# Patient Record
Sex: Female | Born: 1975 | Race: Black or African American | Hispanic: No | Marital: Single | Smoking: Current every day smoker
Health system: Southern US, Community
[De-identification: ages and names within clinical notes are randomized; demographics above are authoritative.]

## PROBLEM LIST (undated history)

## (undated) ENCOUNTER — Inpatient Hospital Stay (HOSPITAL_COMMUNITY): Payer: Self-pay

## (undated) DIAGNOSIS — K219 Gastro-esophageal reflux disease without esophagitis: Secondary | ICD-10-CM

## (undated) DIAGNOSIS — B9681 Helicobacter pylori [H. pylori] as the cause of diseases classified elsewhere: Secondary | ICD-10-CM

## (undated) DIAGNOSIS — O00109 Unspecified tubal pregnancy without intrauterine pregnancy: Secondary | ICD-10-CM

## (undated) HISTORY — PX: ECTOPIC PREGNANCY SURGERY: SHX613

## (undated) HISTORY — PX: OTHER SURGICAL HISTORY: SHX169

## (undated) HISTORY — PX: TUBAL LIGATION: SHX77

## (undated) HISTORY — PX: ANKLE SURGERY: SHX546

---

## 2014-09-16 ENCOUNTER — Emergency Department (HOSPITAL_BASED_OUTPATIENT_CLINIC_OR_DEPARTMENT_OTHER)
Admission: EM | Admit: 2014-09-16 | Discharge: 2014-09-16 | Disposition: A | Discharge status: Home / Self Care | Payer: Medicaid Other | Attending: Emergency Medicine | Admitting: Emergency Medicine

## 2014-09-16 ENCOUNTER — Encounter (HOSPITAL_BASED_OUTPATIENT_CLINIC_OR_DEPARTMENT_OTHER): Payer: Self-pay | Admitting: *Deleted

## 2014-09-16 DIAGNOSIS — Z720 Tobacco use: Secondary | ICD-10-CM | POA: Insufficient documentation

## 2014-09-16 DIAGNOSIS — Z3202 Encounter for pregnancy test, result negative: Secondary | ICD-10-CM | POA: Diagnosis not present

## 2014-09-16 DIAGNOSIS — M549 Dorsalgia, unspecified: Secondary | ICD-10-CM | POA: Insufficient documentation

## 2014-09-16 DIAGNOSIS — N939 Abnormal uterine and vaginal bleeding, unspecified: Secondary | ICD-10-CM | POA: Insufficient documentation

## 2014-09-16 DIAGNOSIS — Z880 Allergy status to penicillin: Secondary | ICD-10-CM | POA: Diagnosis not present

## 2014-09-16 DIAGNOSIS — R109 Unspecified abdominal pain: Secondary | ICD-10-CM

## 2014-09-16 DIAGNOSIS — Z91040 Latex allergy status: Secondary | ICD-10-CM | POA: Insufficient documentation

## 2014-09-16 HISTORY — DX: Unspecified tubal pregnancy without intrauterine pregnancy: O00.109

## 2014-09-16 LAB — WET PREP, GENITAL
Trich, Wet Prep: NONE SEEN
YEAST WET PREP: NONE SEEN

## 2014-09-16 LAB — HCG, QUANTITATIVE, PREGNANCY: hCG, Beta Chain, Quant, S: 2 m[IU]/mL (ref <5)

## 2014-09-16 MED ORDER — IBUPROFEN 400 MG PO TABS: 400.0000 mg | ORAL_TABLET | Freq: Four times a day (QID) | ORAL | Status: DC | PRN

## 2014-09-16 NOTE — ED Provider Notes (Signed)
CSN: [id number]     Arrival date & time 09/16/14  1935 History  This chart is scribed for non-physician practitioner, Joycie PeekBenjamin Warnell Rasnic, PA-C, working with Geoffery Lyonsouglas Delo, MD by Abel PrestoKara Demonbreun, ED Scribe.  This patient was seen in room MH03/MH03 and the patient's care was started 8:01 PM.     Chief Complaint  Patient presents with  . Vaginal Bleeding     The history is provided by the patient. No language interpreter was used.   HPI Comments: Alexandra Lopez is a 39 y.o. female who presents to the Emergency Department complaining of vaginal spotting with onset this morning, worsening this evening to heavier bleeding. Pt denies passing clots but states blood seems like a "jelly". Pt notes associated lower back pain and abdominal bloating. Pt was just told she is [redacted] weeks pregnant. Per pt's records pt was seen for a pap smear f/u and was told that she is pregnant. Pt was then seen yesterday for vaginal bleeding and cramping at [facility] with US done showing no intrauterine pregnancy. Pt's GPA is 7/4/2. Pt denies fever, chills, nausea, and vomiting.   Past Medical History  Diagnosis Date  . Tubal pregnancy    History reviewed. No pertinent past surgical history. History reviewed. No pertinent family history. History  Substance Use Topics  . Smoking status: Current Every Day Smoker -- 0.50 packs/day    Types: Cigarettes  . Smokeless tobacco: Not on file  . Alcohol Use: No   OB History    No data available     Review of Systems  Constitutional: Negative for fever and chills.  Gastrointestinal: Positive for abdominal pain and abdominal distention. Negative for nausea and vomiting.  Genitourinary: Positive for vaginal bleeding.  Musculoskeletal: Positive for back pain.  All other systems reviewed and are negative.     Allergies  Latex and Penicillins  Home Medications   Prior to Admission medications   Medication Sig Start Date End Date Taking? Authorizing Provider   ibuprofen (ADVIL,MOTRIN) 400 MG tablet Take 1 tablet (400 mg total) by mouth every 6 (six) hours as needed. 09/16/14   Joycie PeekBenjamin Adelaida Reindel, PA-C   BP 122/79 mmHg  Pulse 61  Temp(Src) 98.4 F (36.9 C)  Resp 16  Ht 5\' 4"  (1.626 m)  Wt 130 lb (58.968 kg)  BMI 22.30 kg/m2  SpO2 100%  LMP 08/21/2014 Physical Exam  Constitutional: She is oriented to person, place, and time. She appears well-developed and well-nourished.  HENT:  Head: Normocephalic.  Eyes: Conjunctivae are normal.  Neck: Normal range of motion. Neck supple.  Cardiovascular: Normal rate, regular rhythm and normal heart sounds.  Exam reveals no friction rub.   No murmur heard. Pulmonary/Chest: Breath sounds normal. No respiratory distress. She has no wheezes. She has no rales.  Clear to auscultation bilat  Abdominal: Soft. She exhibits no distension and no mass. There is no tenderness. There is no rebound and no guarding.  Genitourinary:  Chaperone was present for the entire genital exam. No lesions or rashes appreciated on vulva. Cervix visualized on speculum exam and appropriate cultures sampled. Copious blood in vaginal vault and coming from cervical os. Os appears closed Discharge: Not appreciated Upon bi manual exam- No TTP of the adnexa, no cervical motion tenderness. No fullness or masses appreciated. No abnormalities appreciated in structural anatomy.   Musculoskeletal: Normal range of motion.  Neurological: She is alert and oriented to person, place, and time.  Skin: Skin is warm and dry.  Psychiatric: She has a  normal mood and affect. Her behavior is normal.  Nursing note and vitals reviewed.   ED Course  Procedures (including critical care time) DIAGNOSTIC STUDIES: Oxygen Saturation is 100% on room air, normal by my interpretation.    COORDINATION OF CARE: 8:06 PM Discussed treatment plan with patient at beside, the patient agrees with the plan and has no further questions at this time.   Labs  Review Labs Reviewed  WET PREP, GENITAL - Abnormal; Notable for the following:    Clue Cells Wet Prep HPF POC FEW (*)    WBC, Wet Prep HPF POC FEW (*)    All other components within normal limits  HCG, QUANTITATIVE, PREGNANCY  HIV ANTIBODY (ROUTINE TESTING)  GC/CHLAMYDIA PROBE AMP ([place]) NOT AT [facility]    Imaging Review No results found.   EKG Interpretation None      MDM  Vitals stable - WNL -afebrile Pt resting comfortably in ED. PE--copious blood on pelvic exam. No obvious products of conception. Os appears closed. Labwork--beta hCG 2.  DDX--patient seen yesterday at [facility] where they conducted a OB ultrasound that was negative, beta hCG less than 5. Patient reports she has not missed a period. After her evaluation today, this is most likely early menstrual period or miscarriage. There is no evidence of current viable pregnancy. Discussed follow-up with primary care for further evaluation and management of symptoms.  I discussed all relevant lab findings and imaging results with pt and they verbalized understanding. Discussed f/u with PCP within 48 hrs and return precautions, pt very amenable to plan. Prior to patient discharge, I discussed and reviewed this case with Dr. Judd Lien   Final diagnoses:  Abdominal cramping  Vaginal bleeding    I personally performed the services described in this documentation, which was scribed in my presence. The recorded information has been reviewed and is accurate.     Joycie Peek, PA-C 09/16/14 2115  Geoffery Lyons, MD 09/19/14 [phone]

## 2014-09-16 NOTE — Discharge Instructions (Signed)
You were evaluated in the ED for your abdominal discomfort and vaginal bleeding. There does not appear to be an emergent cause for your symptoms at this time. This is likely your menstrual cycle. Your beta hCG was negative and there is no evidence of a current pregnancy. Please follow-up with your primary care for further evaluation and management of your symptoms. Return to ED for new or worsening symptoms.  Abdominal Pain, Women Abdominal (stomach, pelvic, or belly) pain can be caused by many things. It is important to tell your doctor:  The location of the pain.  Does it come and go or is it present all the time?  Are there things that start the pain (eating certain foods, exercise)?  Are there other symptoms associated with the pain (fever, nausea, vomiting, diarrhea)? All of this is helpful to know when trying to find the cause of the pain. CAUSES   Stomach: virus or bacteria infection, or ulcer.  Intestine: appendicitis (inflamed appendix), regional ileitis (Crohn's disease), ulcerative colitis (inflamed colon), irritable bowel syndrome, diverticulitis (inflamed diverticulum of the colon), or cancer of the stomach or intestine.  Gallbladder disease or stones in the gallbladder.  Kidney disease, kidney stones, or infection.  Pancreas infection or cancer.  Fibromyalgia (pain disorder).  Diseases of the female organs:  Uterus: fibroid (non-cancerous) tumors or infection.  Fallopian tubes: infection or tubal pregnancy.  Ovary: cysts or tumors.  Pelvic adhesions (scar tissue).  Endometriosis (uterus lining tissue growing in the pelvis and on the pelvic organs).  Pelvic congestion syndrome (female organs filling up with blood just before the menstrual period).  Pain with the menstrual period.  Pain with ovulation (producing an egg).  Pain with an IUD (intrauterine device, birth control) in the uterus.  Cancer of the female organs.  Functional pain (pain not caused by  a disease, may improve without treatment).  Psychological pain.  Depression. DIAGNOSIS  Your doctor will decide the seriousness of your pain by doing an examination.  Blood tests.  X-rays.  Ultrasound.  CT scan (computed tomography, special type of X-ray).  MRI (magnetic resonance imaging).  Cultures, for infection.  Barium enema (dye inserted in the large intestine, to better view it with X-rays).  Colonoscopy (looking in intestine with a lighted tube).  Laparoscopy (minor surgery, looking in abdomen with a lighted tube).  Major abdominal exploratory surgery (looking in abdomen with a large incision). TREATMENT  The treatment will depend on the cause of the pain.   Many cases can be observed and treated at home.  Over-the-counter medicines recommended by your caregiver.  Prescription medicine.  Antibiotics, for infection.  Birth control pills, for painful periods or for ovulation pain.  Hormone treatment, for endometriosis.  Nerve blocking injections.  Physical therapy.  Antidepressants.  Counseling with a psychologist or psychiatrist.  Minor or major surgery. HOME CARE INSTRUCTIONS   Do not take laxatives, unless directed by your caregiver.  Take over-the-counter pain medicine only if ordered by your caregiver. Do not take aspirin because it can cause an upset stomach or bleeding.  Try a clear liquid diet (broth or water) as ordered by your caregiver. Slowly move to a bland diet, as tolerated, if the pain is related to the stomach or intestine.  Have a thermometer and take your temperature several times a day, and record it.  Bed rest and sleep, if it helps the pain.  Avoid sexual intercourse, if it causes pain.  Avoid stressful situations.  Keep your follow-up appointments and tests,  as your caregiver orders.  If the pain does not go away with medicine or surgery, you may try:  Acupuncture.  Relaxation exercises (yoga, meditation).  Group  therapy.  Counseling. SEEK MEDICAL CARE IF:   You notice certain foods cause stomach pain.  Your home care treatment is not helping your pain.  You need stronger pain medicine.  You want your IUD removed.  You feel faint or lightheaded.  You develop nausea and vomiting.  You develop a rash.  You are having side effects or an allergy to your medicine. SEEK IMMEDIATE MEDICAL CARE IF:   Your pain does not go away or gets worse.  You have a fever.  Your pain is felt only in portions of the abdomen. The right side could possibly be appendicitis. The left lower portion of the abdomen could be colitis or diverticulitis.  You are passing blood in your stools (bright red or black tarry stools, with or without vomiting).  You have blood in your urine.  You develop chills, with or without a fever.  You pass out. MAKE SURE YOU:   Understand these instructions.  Will watch your condition.  Will get help right away if you are not doing well or get worse. Document Released: 12/19/2006 Document Revised: 07/08/2013 Document Reviewed: 01/08/2009 [facility] Patient Information 2015 [employer] [place]. This information is not intended to replace advice given to you by your health care provider. Make sure you discuss any questions you have with your health care provider.

## 2014-09-16 NOTE — ED Notes (Signed)
Pt c/o vaginal bleeding x 12 hrs pt is 3 weeks preg c/o lower back and  abd pain.

## 2014-09-17 LAB — GC/CHLAMYDIA PROBE AMP (~~LOC~~) NOT AT ARMC
Chlamydia: NEGATIVE
Neisseria Gonorrhea: NEGATIVE

## 2014-09-18 LAB — HIV ANTIBODY (ROUTINE TESTING W REFLEX): HIV Screen 4th Generation wRfx: NONREACTIVE

## 2017-08-02 ENCOUNTER — Encounter (HOSPITAL_BASED_OUTPATIENT_CLINIC_OR_DEPARTMENT_OTHER): Payer: Self-pay

## 2017-08-02 ENCOUNTER — Emergency Department (HOSPITAL_BASED_OUTPATIENT_CLINIC_OR_DEPARTMENT_OTHER): Payer: Medicaid Other

## 2017-08-02 ENCOUNTER — Emergency Department (HOSPITAL_BASED_OUTPATIENT_CLINIC_OR_DEPARTMENT_OTHER)
Admission: EM | Admit: 2017-08-02 | Discharge: 2017-08-02 | Disposition: A | Discharge status: Home / Self Care | Payer: Medicaid Other | Attending: Emergency Medicine | Admitting: Emergency Medicine

## 2017-08-02 DIAGNOSIS — O9989 Other specified diseases and conditions complicating pregnancy, childbirth and the puerperium: Secondary | ICD-10-CM | POA: Diagnosis not present

## 2017-08-02 DIAGNOSIS — O209 Hemorrhage in early pregnancy, unspecified: Secondary | ICD-10-CM | POA: Insufficient documentation

## 2017-08-02 DIAGNOSIS — O99331 Smoking (tobacco) complicating pregnancy, first trimester: Secondary | ICD-10-CM | POA: Diagnosis not present

## 2017-08-02 DIAGNOSIS — O4690 Antepartum hemorrhage, unspecified, unspecified trimester: Secondary | ICD-10-CM

## 2017-08-02 DIAGNOSIS — F1210 Cannabis abuse, uncomplicated: Secondary | ICD-10-CM | POA: Insufficient documentation

## 2017-08-02 DIAGNOSIS — R1030 Lower abdominal pain, unspecified: Secondary | ICD-10-CM | POA: Insufficient documentation

## 2017-08-02 DIAGNOSIS — F17210 Nicotine dependence, cigarettes, uncomplicated: Secondary | ICD-10-CM | POA: Diagnosis not present

## 2017-08-02 DIAGNOSIS — Z3A08 8 weeks gestation of pregnancy: Secondary | ICD-10-CM | POA: Diagnosis not present

## 2017-08-02 MED ORDER — ACETAMINOPHEN 325 MG PO TABS
650.0000 mg | ORAL_TABLET | Freq: Once | ORAL | Status: AC
  Administered 2017-08-02: 650 mg via ORAL
  Filled 2017-08-02: qty 2

## 2017-08-02 NOTE — ED Notes (Signed)
Water provided to pt per ok from EDP.

## 2017-08-02 NOTE — ED Triage Notes (Signed)
Pt states she is [redacted] weeks pregnant-had vaginal spotting last week-vaginal bleeding started today-panty liner x 2 in place-NAD-steady gait

## 2017-08-02 NOTE — Discharge Instructions (Addendum)
It was so nice to meet you.  You came into the emergency department with vaginal bleeding. Your ultrasound showed a normal appearing fetus with a heart rate of 160. The ultrasound did not identify any cause of your bleeding. Please make sure you follow-up with your OBGYN in the next week.

## 2017-08-02 NOTE — ED Provider Notes (Signed)
[facility] EMERGENCY DEPARTMENT Provider Note   CSN: [id number] Arrival date & time: 08/02/17  1718   History   Chief Complaint Chief Complaint  Patient presents with  . Vaginal Bleeding    HPI Alexandra Lopez is a 42 y.o. female [phone] at [redacted] weeks gestation presenting to the ED with vaginal bleeding that started today. She states she was about to take a bath when she noticed that she had "blood all in her underwear". She states that it looked like "blood and particles". The bleeding has since slowed up. She endorses lower abdominal "pressure" and "crampy pain". She has not had any other issues this pregnancy other than nausea and vomiting.  Past Medical History:  Diagnosis Date  . Tubal pregnancy     There are no active problems to display for this patient.   Past Surgical History:  Procedure Laterality Date  . ANKLE SURGERY    . arm surgery    . ECTOPIC PREGNANCY SURGERY       OB History    Gravida  8   Para  5   Term  5   Preterm      AB  2   Living  5     SAB  1   TAB      Ectopic  1   Multiple      Live Births               Home Medications    Prior to Admission medications   Medication Sig Start Date End Date Taking? Authorizing Provider  ibuprofen (ADVIL,MOTRIN) 400 MG tablet Take 1 tablet (400 mg total) by mouth every 6 (six) hours as needed. 09/16/14   Joycie Peek, PA-C    Family History No family history on file.  Social History Social History   Tobacco Use  . Smoking status: Current Every Day Smoker    Packs/day: 0.50    Types: Cigarettes  . Smokeless tobacco: Never Used  Substance Use Topics  . Alcohol use: No  . Drug use: Yes    Types: Marijuana     Allergies   Latex and Penicillins   Review of Systems Review of Systems  Constitutional: Negative for chills and fever.  Respiratory: Negative for shortness of breath.   Cardiovascular: Negative for chest pain and palpitations.  Gastrointestinal:  Positive for abdominal pain, constipation, nausea and vomiting.  Genitourinary: Negative for difficulty urinating and dysuria.  Musculoskeletal: Negative for back pain and neck pain.  Neurological: Negative for dizziness, light-headedness and headaches.  All other systems reviewed and are negative.  Physical Exam Updated Vital Signs BP (!) 152/89 (BP Location: Left Arm)   Pulse 80   Temp 98.8 F (37.1 C) (Oral)   Resp 18   Ht  (1.626 m)   Wt 64 kg (141 lb)   LMP 06/05/2017   SpO2 100%   BMI 24.20 kg/m   Physical Exam  Constitutional: She appears well-developed and well-nourished. No distress.  HENT:  Head: Normocephalic and atraumatic.  Eyes: Conjunctivae are normal.  Neck: Neck supple.  Cardiovascular: Normal rate and regular rhythm.  No murmur heard. Pulmonary/Chest: Effort normal and breath sounds normal. No respiratory distress.  Abdominal: Soft.  Mild tenderness in the suprapubic and LLQ  Genitourinary:  Genitourinary Comments: External genitalia normal in appearance, vaginal walls normal, cervix open with moderate amount of blood coming from the os  Musculoskeletal: She exhibits no edema.  Neurological: She is alert.  Skin: Skin is  warm and dry.  Psychiatric: She has a normal mood and affect.  Nursing note and vitals reviewed.   ED Treatments / Results  Labs (all labs ordered are listed, but only abnormal results are displayed) Labs Reviewed - No data to display  EKG None  Radiology No results found.  Procedures Procedures (including critical care time)  Medications Ordered in ED Medications - No data to display   Initial Impression / Assessment and Plan / ED Course  I have reviewed the triage vital signs and the nursing notes.  Pertinent labs & imaging results that were available during my care of the patient were reviewed by me and considered in my medical decision making (see chart for details).  42 year old [id number] at [redacted] weeks gestation  presenting with vaginal bleeding. History consistent with a spontaneous abortion. Exam with bright red blood coming from an open cervical ox. ABO/Rh result from 2017 showed that her blood type was O positive, so will not repeat this lab. Transvaginal US showed IUP with HR of 160bpm. [place] was unable to identify a cause of the bleeding. Patient has already has an appointment scheduled with her OBGYN on 08/08/17. She is safe for discharge home.  Final Clinical Impressions(s) / ED Diagnoses   Final diagnoses:  None    ED Discharge Orders    None       Mayo, Allyn Kenner, MD 08/02/17 2157    Maia Plan, MD 08/03/17 1321

## 2017-08-06 ENCOUNTER — Other Ambulatory Visit: Payer: Self-pay

## 2017-08-06 ENCOUNTER — Inpatient Hospital Stay (HOSPITAL_COMMUNITY)
Admission: AD | Admit: 2017-08-06 | Discharge: 2017-08-06 | Disposition: A | Discharge status: Home / Self Care | Payer: Medicaid Other | Source: Ambulatory Visit | Attending: Family Medicine | Admitting: Family Medicine

## 2017-08-06 ENCOUNTER — Encounter (HOSPITAL_COMMUNITY): Payer: Self-pay | Admitting: *Deleted

## 2017-08-06 DIAGNOSIS — O99331 Smoking (tobacco) complicating pregnancy, first trimester: Secondary | ICD-10-CM | POA: Insufficient documentation

## 2017-08-06 DIAGNOSIS — Z91040 Latex allergy status: Secondary | ICD-10-CM | POA: Diagnosis not present

## 2017-08-06 DIAGNOSIS — F17210 Nicotine dependence, cigarettes, uncomplicated: Secondary | ICD-10-CM | POA: Diagnosis not present

## 2017-08-06 DIAGNOSIS — Z3A00 Weeks of gestation of pregnancy not specified: Secondary | ICD-10-CM | POA: Insufficient documentation

## 2017-08-06 DIAGNOSIS — N939 Abnormal uterine and vaginal bleeding, unspecified: Secondary | ICD-10-CM | POA: Diagnosis present

## 2017-08-06 DIAGNOSIS — O209 Hemorrhage in early pregnancy, unspecified: Secondary | ICD-10-CM | POA: Diagnosis not present

## 2017-08-06 DIAGNOSIS — Z880 Allergy status to penicillin: Secondary | ICD-10-CM | POA: Insufficient documentation

## 2017-08-06 LAB — CBC
HCT: 37.9 % (ref 36.0–46.0)
HEMOGLOBIN: 13 g/dL (ref 12.0–15.0)
MCH: 29.9 pg (ref 26.0–34.0)
MCHC: 34.3 g/dL (ref 30.0–36.0)
MCV: 87.1 fL (ref 78.0–100.0)
Platelets: 395 10*3/uL (ref 150–400)
RBC: 4.35 MIL/uL (ref 3.87–5.11)
RDW: 13.9 % (ref 11.5–15.5)
WBC: 10.2 10*3/uL (ref 4.0–10.5)

## 2017-08-06 LAB — ABO/RH: ABO/RH(D): O POS

## 2017-08-06 MED ORDER — ONDANSETRON 8 MG PO TBDP
8.0000 mg | ORAL_TABLET | Freq: Once | ORAL | Status: AC
  Administered 2017-08-06: 8 mg via ORAL
  Filled 2017-08-06: qty 1

## 2017-08-06 NOTE — MAU Provider Note (Signed)
History   [id number] in with vaginal bleeding that has been going on for a week was seen on 5/28 and noted to have viable fetus at 8 wks. Has to change pads 2-3 times a day. No clots noted today. Also c/o some nausea.  CSN: [id number]  Arrival date & time 08/06/17  1326   None     Chief Complaint  Patient presents with  . Abdominal Pain  . Vaginal Bleeding    HPI  Past Medical History:  Diagnosis Date  . Tubal pregnancy     Past Surgical History:  Procedure Laterality Date  . ANKLE SURGERY    . arm surgery    . ECTOPIC PREGNANCY SURGERY      History reviewed. No pertinent family history.  Social History   Tobacco Use  . Smoking status: Current Every Day Smoker    Packs/day: 0.25    Types: Cigarettes  . Smokeless tobacco: Never Used  Substance Use Topics  . Alcohol use: No  . Drug use: Yes    Types: Marijuana    Comment: occ    OB History    Gravida  8   Para  5   Term  5   Preterm      AB  2   Living  5     SAB  1   TAB      Ectopic  1   Multiple      Live Births              Review of Systems  Constitutional: Negative.   HENT: Negative.   Eyes: Negative.   Respiratory: Negative.   Cardiovascular: Negative.   Gastrointestinal: Positive for abdominal pain.  Endocrine: Negative.   Genitourinary: Positive for vaginal bleeding.  Musculoskeletal: Negative.   Skin: Negative.   Allergic/Immunologic: Negative.   Neurological: Negative.   Hematological: Negative.   Psychiatric/Behavioral: Negative.     Allergies  Latex and Penicillins  Home Medications    BP 101/67 (BP Location: Right Arm)   Pulse 63   Temp 98.3 F (36.8 C) (Oral)   Resp 18   Wt 139 lb 12 oz (63.4 kg)   LMP 06/05/2017   SpO2 100%   BMI 23.99 kg/m   Physical Exam  Constitutional: She is oriented to person, place, and time. She appears well-developed and well-nourished.  HENT:  Head: Normocephalic.  Cardiovascular: Normal rate, regular rhythm, normal  heart sounds and intact distal pulses.  Pulmonary/Chest: Effort normal and breath sounds normal.  Abdominal: Normal appearance.  Genitourinary: Vagina normal and uterus normal.  Neurological: She is alert and oriented to person, place, and time.  Skin: Skin is warm and dry.  Psychiatric: She has a normal mood and affect. Her behavior is normal.    MAU Course  Procedures (including critical care time)  Labs Reviewed  CBC  ABO/RH   No results found.   1. Vaginal bleeding in pregnancy, first trimester       MDM  VSS, scant amt dark vag bleeding. bedfside u/s shows viable fetus with FHR 150's. Miscarriage precautions given and to f/o with her OB next week.

## 2017-08-06 NOTE — MAU Note (Addendum)
Just keeps bleeding really bad, and her stomach keeps hurting.  Has been going on for like a wk now.  Bleeding had let up, but last night became heavy again. Now having back pain also

## 2017-08-06 NOTE — MAU Note (Deleted)
Had picture of what was in toilet. Was white, approx 1 in long, reddish in middle, ? Tissue.

## 2017-08-06 NOTE — Discharge Instructions (Signed)
Vaginal Bleeding During Pregnancy, First Trimester °A small amount of bleeding (spotting) from the vagina is common in early pregnancy. Sometimes the bleeding is normal and is not a problem, and sometimes it is a sign of something serious. Be sure to tell your doctor about any bleeding from your vagina right away. °Follow these instructions at home: °· Watch your condition for any changes. °· Follow your doctor's instructions about how active you can be. °· If you are on bed rest: °? You may need to stay in bed and only get up to use the bathroom. °? You may be allowed to do some activities. °? If you need help, make plans for someone to help you. °· Write down: °? The number of pads you use each day. °? How often you change pads. °? How soaked (saturated) your pads are. °· Do not use tampons. °· Do not douche. °· Do not have sex or orgasms until your doctor says it is okay. °· If you pass any tissue from your vagina, save the tissue so you can show it to your doctor. °· Only take medicines as told by your doctor. °· Do not take aspirin because it can make you bleed. °· Keep all follow-up visits as told by your doctor. °Contact a doctor if: °· You bleed from your vagina. °· You have cramps. °· You have labor pains. °· You have a fever that does not go away after you take medicine. °Get help right away if: °· You have very bad cramps in your back or belly (abdomen). °· You pass large clots or tissue from your vagina. °· You bleed more. °· You feel light-headed or weak. °· You pass out (faint). °· You have chills. °· You are leaking fluid or have a gush of fluid from your vagina. °· You pass out while pooping (having a bowel movement). °This information is not intended to replace advice given to you by your health care provider. Make sure you discuss any questions you have with your health care provider. °Document Released: 07/08/2013 Document Revised: 07/30/2015 Document Reviewed: 10/29/2012 °Elsevier Interactive  Patient Education © 2018 [employer] Inc. ° °

## 2018-06-04 ENCOUNTER — Encounter (HOSPITAL_COMMUNITY): Payer: Self-pay

## 2019-08-12 IMAGING — US US OB TRANSVAGINAL
1 series · 14 of 28 positions shown · non-contrast
Comparison: None.

CLINICAL DATA: Pregnant patient.  Vaginal bleeding.

EXAM:
OBSTETRIC <14 WK US AND TRANSVAGINAL OB US
TECHNIQUE: Both transabdominal and transvaginal ultrasound examinations were
performed for complete evaluation of the gestation as well as the
maternal uterus, adnexal regions, and pelvic cul-de-sac.
Transvaginal technique was performed to assess early pregnancy.

[Series 1: us ob transvaginal · 0.13mm/px · 29 acquisitions, 14 frames shown]
[im 2/29]
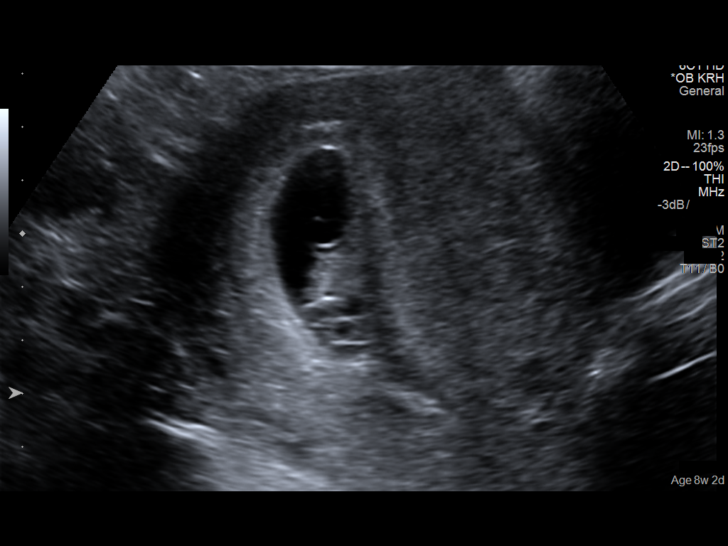
[im 4/29]
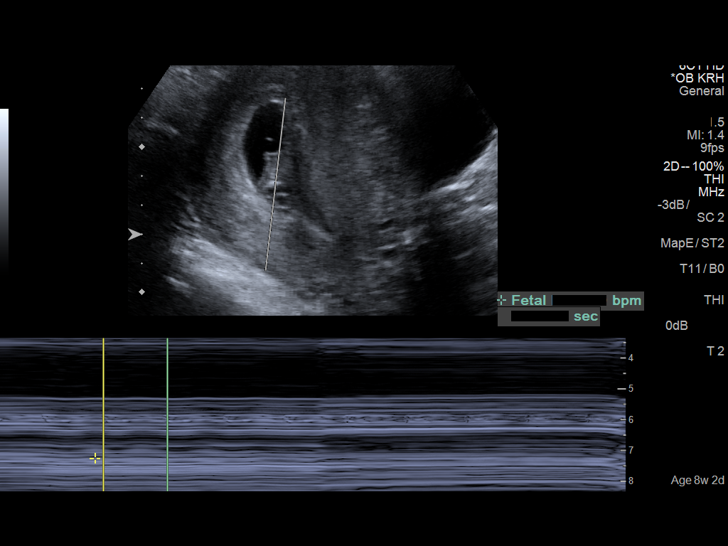
[im 6/29]
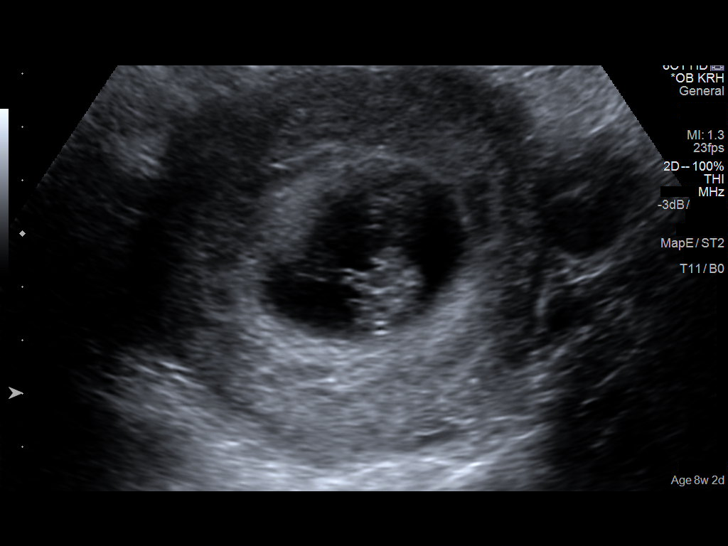
[im 8/29]
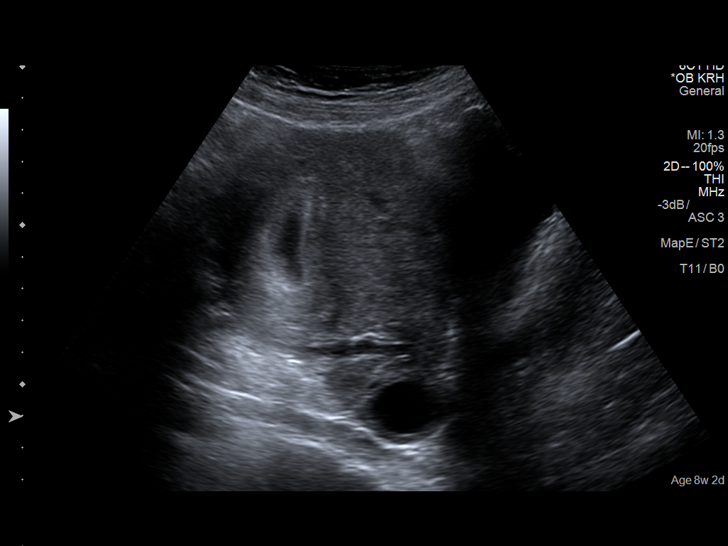
[im 10/29]
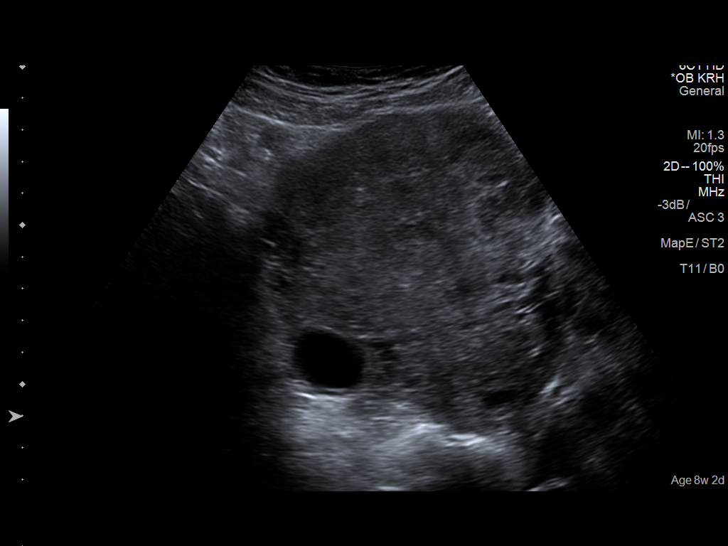
[im 12/29]
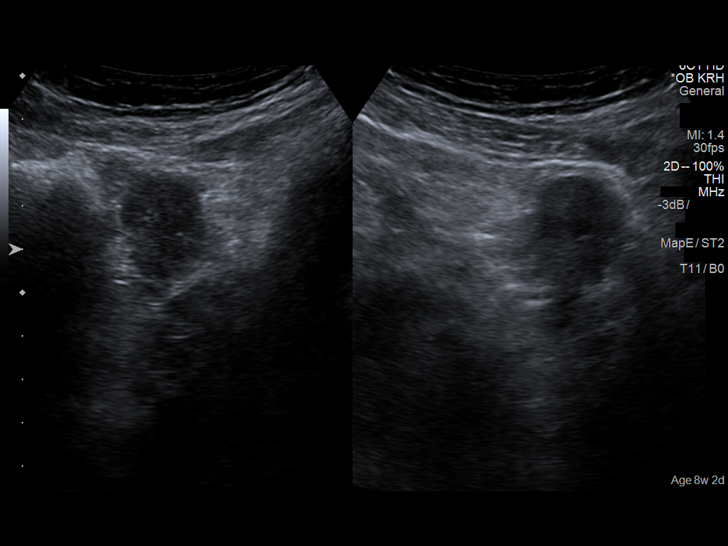
[im 14/29]
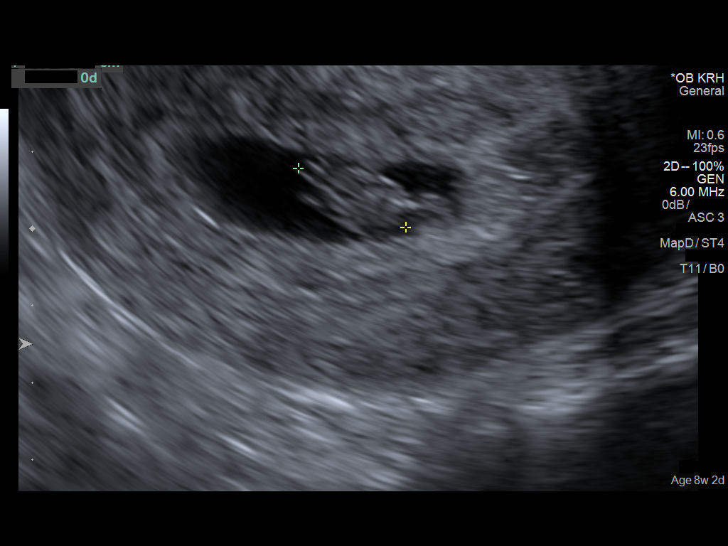
[im 16/29]
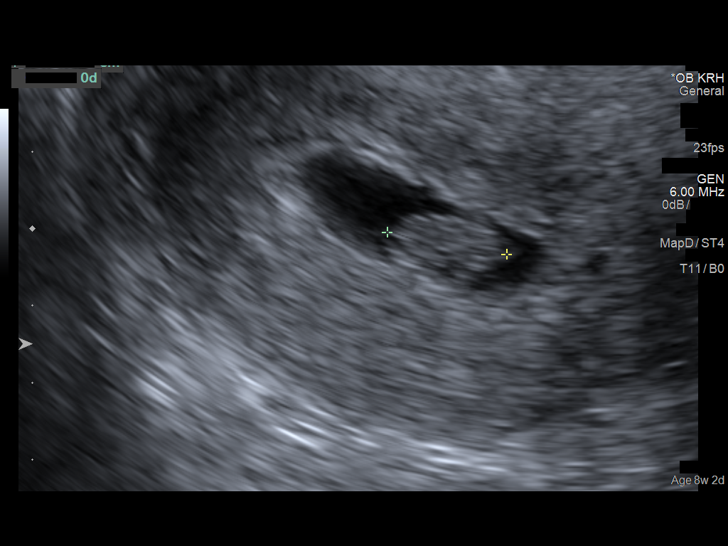
[im 18/29]
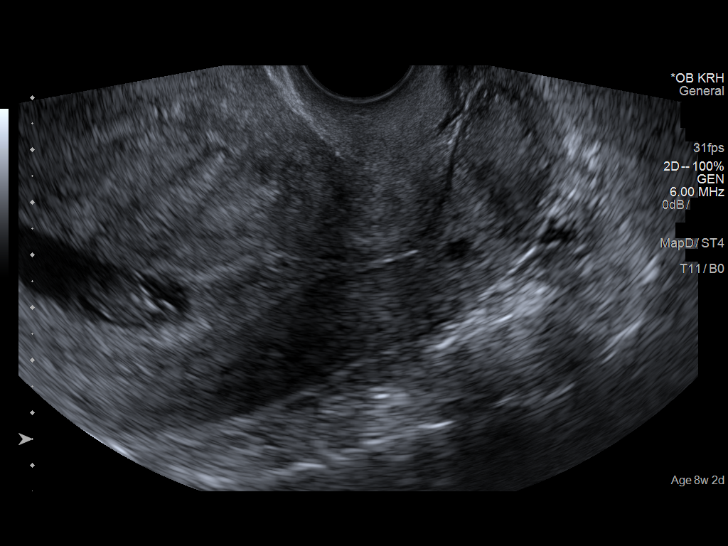
[im 20/29]
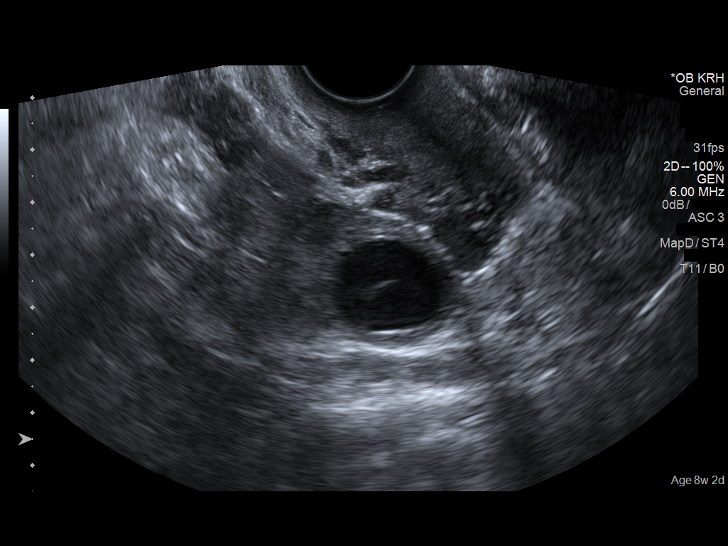
[im 22/29]
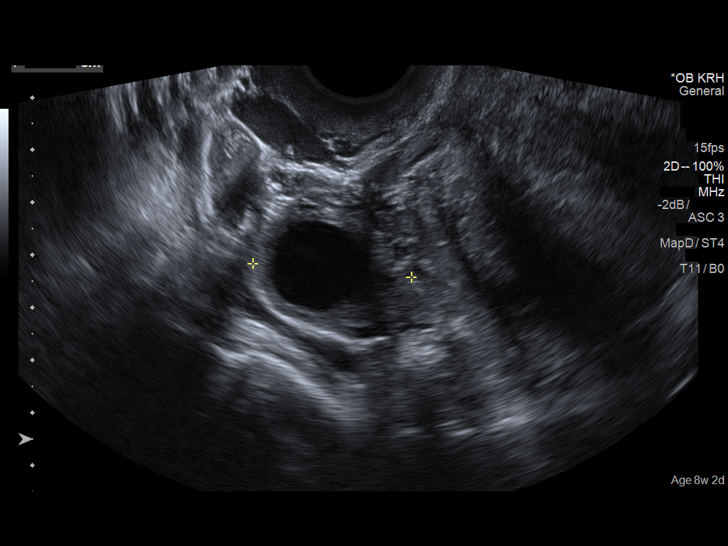
[im 24/29]
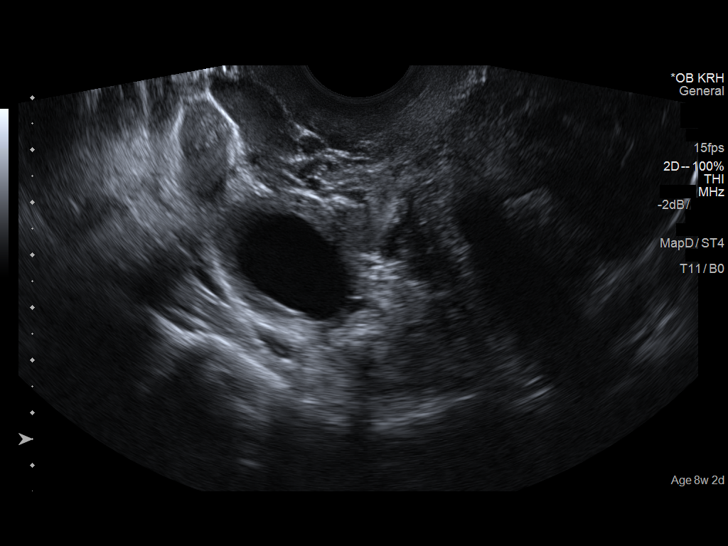
[im 26/29]
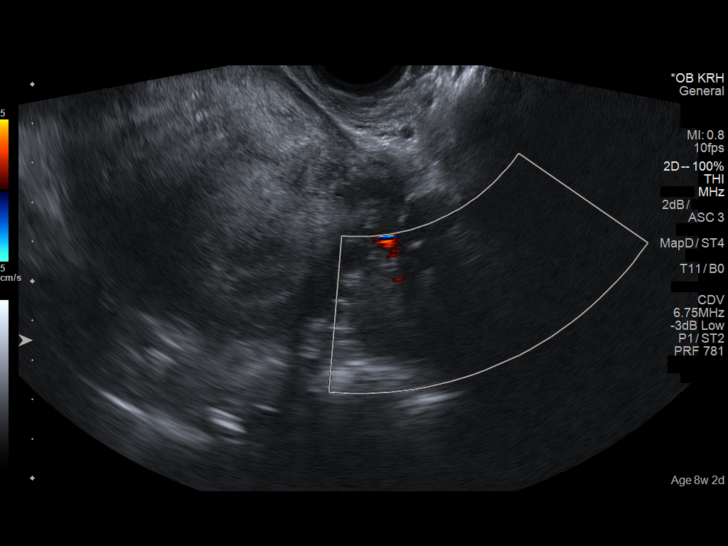
[im 29/29]
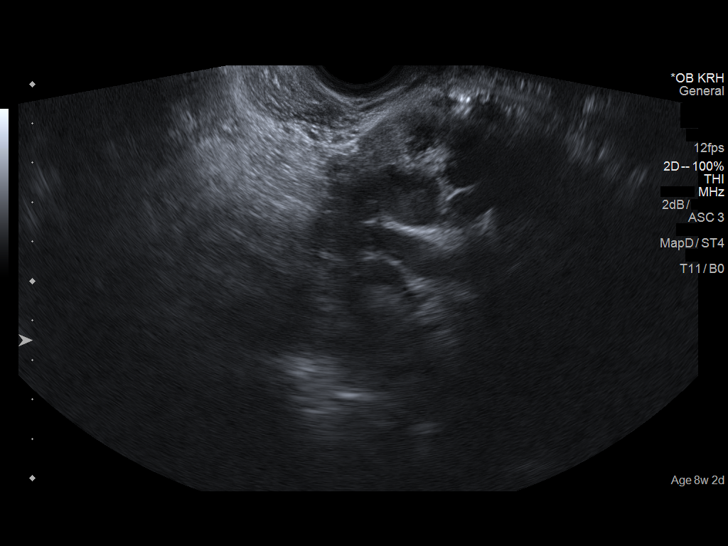

[14 of 28 positions shown; findings below may reference images not displayed]

FINDINGS: Intrauterine gestational sac: Single

Yolk sac:  Visualized.

Embryo:  Visualized.

Cardiac Activity: Visualized.

Heart Rate: 160 bpm

MSD:   mm    w     d

CRL:  15.6 mm   8 w   0 d                  US EDC: Wednesday March, 2018

Subchorionic hemorrhage:  None visualized.

Maternal uterus/adnexae: Normal
IMPRESSION: Single live IUP.  No cause for vaginal bleeding identified.

## 2019-12-26 ENCOUNTER — Emergency Department (HOSPITAL_BASED_OUTPATIENT_CLINIC_OR_DEPARTMENT_OTHER)
Admission: EM | Admit: 2019-12-26 | Discharge: 2019-12-26 | Disposition: A | Discharge status: Home / Self Care | Payer: Medicaid Other | Attending: Emergency Medicine | Admitting: Emergency Medicine

## 2019-12-26 ENCOUNTER — Other Ambulatory Visit: Payer: Self-pay

## 2019-12-26 ENCOUNTER — Encounter (HOSPITAL_BASED_OUTPATIENT_CLINIC_OR_DEPARTMENT_OTHER): Payer: Self-pay | Admitting: *Deleted

## 2019-12-26 DIAGNOSIS — M79645 Pain in left finger(s): Secondary | ICD-10-CM | POA: Diagnosis present

## 2019-12-26 DIAGNOSIS — F17210 Nicotine dependence, cigarettes, uncomplicated: Secondary | ICD-10-CM | POA: Diagnosis not present

## 2019-12-26 NOTE — Discharge Instructions (Signed)
You were seen earlier this year by Dr. Kelli Hope of [facility].  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  At that time, it looks like they recommended an MRI of your thumb.  You can contact [phone] and request a follow-up appointment with Dr. Thad Ranger.

## 2019-12-26 NOTE — ED Triage Notes (Signed)
C/o left thumb pain x 1 year

## 2019-12-26 NOTE — ED Provider Notes (Signed)
[facility] EMERGENCY DEPARTMENT Provider Note   CSN: [id number] Arrival date & time: 12/26/19  1726     History Chief Complaint  Patient presents with  . Hand Pain    Alexandra Lopez is a 44 y.o. female.  Patient presents to the emergency department for evaluation of ongoing left thumb pain.  Patient reports that she was evaluated by a specialist earlier this year.  She states that she was told that she had a tumor in her finger and that she was supposed to have imaging performed to evaluate for this.  She thinks that she received a letter stating that this was not covered and she never followed up.  No other treatments prior to arrival.  She states that it is difficult for her to use her thumb because of the pain.  No fevers, redness, or swelling.  She is requesting imaging here today.        Past Medical History:  Diagnosis Date  . Tubal pregnancy     There are no problems to display for this patient.   Past Surgical History:  Procedure Laterality Date  . ANKLE SURGERY    . arm surgery    . ECTOPIC PREGNANCY SURGERY    . TUBAL LIGATION       OB History    Gravida  8   Para  5   Term  5   Preterm      AB  2   Living  5     SAB  1   TAB      Ectopic  1   Multiple      Live Births              No family history on file.  Social History   Tobacco Use  . Smoking status: Current Every Day Smoker    Packs/day: 0.25    Types: Cigarettes  . Smokeless tobacco: Never Used  Substance Use Topics  . Alcohol use: No  . Drug use: Yes    Types: Marijuana    Comment: occ    Home Medications Prior to Admission medications   Not on File    Allergies    Latex and Penicillins  Review of Systems   Review of Systems  Constitutional: Negative for fever.  Musculoskeletal: Positive for myalgias. Negative for arthralgias and joint swelling.  Skin: Negative for color change and wound.  Neurological: Negative for numbness.    Physical  Exam Updated Vital Signs BP 123/80   Pulse 62   Temp 98.3 F (36.8 C) (Oral)   Resp 18   Ht 5\' 4"  (1.626 m)   Wt 60.8 kg   LMP 12/22/2019   SpO2 99%   BMI 23.00 kg/m   Physical Exam Vitals and nursing note reviewed.  Constitutional:      Appearance: She is well-developed.  HENT:     Head: Normocephalic and atraumatic.  Eyes:     Conjunctiva/sclera: Conjunctivae normal.  Pulmonary:     Effort: No respiratory distress.  Musculoskeletal:     Cervical back: Normal range of motion and neck supple.     Comments: Externally, patient's left thumb is normal in appearance.  She has normal range of motion at the IP and MCP joints.  She is exquisitely tender to palpation over the ulnar aspect of the thumb at the edge of the nail and onto the pulp.  No signs of eponychia or paronychia.  Nail appears normal.  Skin:  General: Skin is warm and dry.  Neurological:     Mental Status: She is alert.     ED Results / Procedures / Treatments   Labs (all labs ordered are listed, but only abnormal results are displayed) Labs Reviewed - No data to display  EKG None  Radiology No results found.  Procedures Procedures (including critical care time)  Medications Ordered in ED Medications - No data to display  ED Course  I have reviewed the triage vital signs and the nursing notes.  Pertinent labs & imaging results that were available during my care of the patient were reviewed by me and considered in my medical decision making (see chart for details).  Patient seen and examined.  I reviewed the patient's previous notes from earlier in the year when she saw Dr. Thad Ranger of plastic surgery.  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  At that time, MRI with and without contrast was recommended.   Vital signs reviewed and are as follows: BP 123/80   Pulse 62   Temp 98.3 F (36.8 C) (Oral)   Resp 18   Ht 5\' 4"  (1.626 m)   Wt 60.8 kg   LMP 12/22/2019   SpO2 99%   BMI 23.00  kg/m   I discussed with patient that I do not have the ability to obtain his imaging test today and I do not feel that it is emergently indicated.  We discussed that at this time, it would be best for her to return to her plastic surgeon for further evaluation.  She agrees.  Plan for discharge home.   MDM Rules/Calculators/A&P                          Patient with chronic thumb pain, need for outpatient evaluation.  No emergent conditions noted today including paronychia, necrosis, hematoma.  Patient referred back to her plastic surgeon.   Final Clinical Impression(s) / ED Diagnoses Final diagnoses:  Pain of left thumb    Rx / DC Orders ED Discharge Orders    None       12/24/2019, PA-C 12/26/19 1831    12/28/19, MD 12/26/19 2237

## 2019-12-26 NOTE — ED Notes (Signed)
Pt noted to no longer be in hallway bed. Pt not found in ER. Provider aware

## 2020-10-25 ENCOUNTER — Emergency Department (HOSPITAL_BASED_OUTPATIENT_CLINIC_OR_DEPARTMENT_OTHER): Payer: Medicaid Other

## 2020-10-25 ENCOUNTER — Encounter (HOSPITAL_BASED_OUTPATIENT_CLINIC_OR_DEPARTMENT_OTHER): Payer: Self-pay | Admitting: Emergency Medicine

## 2020-10-25 ENCOUNTER — Emergency Department (HOSPITAL_BASED_OUTPATIENT_CLINIC_OR_DEPARTMENT_OTHER)
Admission: EM | Admit: 2020-10-25 | Discharge: 2020-10-25 | Disposition: A | Discharge status: Home / Self Care | Payer: Medicaid Other | Attending: Emergency Medicine | Admitting: Emergency Medicine

## 2020-10-25 DIAGNOSIS — R110 Nausea: Secondary | ICD-10-CM | POA: Diagnosis not present

## 2020-10-25 DIAGNOSIS — R102 Pelvic and perineal pain: Secondary | ICD-10-CM | POA: Insufficient documentation

## 2020-10-25 DIAGNOSIS — K219 Gastro-esophageal reflux disease without esophagitis: Secondary | ICD-10-CM | POA: Diagnosis not present

## 2020-10-25 DIAGNOSIS — F17210 Nicotine dependence, cigarettes, uncomplicated: Secondary | ICD-10-CM | POA: Insufficient documentation

## 2020-10-25 DIAGNOSIS — Z91040 Latex allergy status: Secondary | ICD-10-CM | POA: Diagnosis not present

## 2020-10-25 HISTORY — DX: Gastro-esophageal reflux disease without esophagitis: K21.9

## 2020-10-25 HISTORY — DX: Helicobacter pylori (H. pylori) as the cause of diseases classified elsewhere: B96.81

## 2020-10-25 LAB — COMPREHENSIVE METABOLIC PANEL
ALT: 15 U/L (ref 0–44)
AST: 16 U/L (ref 15–41)
Albumin: 4.2 g/dL (ref 3.5–5.0)
Alkaline Phosphatase: 46 U/L (ref 38–126)
Anion gap: 6 (ref 5–15)
BUN: 11 mg/dL (ref 6–20)
CO2: 25 mmol/L (ref 22–32)
Calcium: 9.2 mg/dL (ref 8.9–10.3)
Chloride: 106 mmol/L (ref 98–111)
Creatinine, Ser: 0.74 mg/dL (ref 0.44–1.00)
GFR, Estimated: >60 mL/min (ref >60)
Glucose, Bld: 82 mg/dL (ref 70–99)
Potassium: 3.9 mmol/L (ref 3.5–5.1)
Sodium: 137 mmol/L (ref 135–145)
Total Bilirubin: 0.4 mg/dL (ref 0.3–1.2)
Total Protein: 7 g/dL (ref 6.5–8.1)

## 2020-10-25 LAB — CBC WITH DIFFERENTIAL/PLATELET
Abs Immature Granulocytes: 0.01 10*3/uL (ref 0.00–0.07)
Basophils Absolute: 0 10*3/uL (ref 0.0–0.1)
Basophils Relative: 1 %
Eosinophils Absolute: 0 10*3/uL (ref 0.0–0.5)
Eosinophils Relative: 1 %
HCT: 37.4 % (ref 36.0–46.0)
Hemoglobin: 13 g/dL (ref 12.0–15.0)
Immature Granulocytes: 0 %
Lymphocytes Relative: 27 %
Lymphs Abs: 1.6 10*3/uL (ref 0.7–4.0)
MCH: 30.1 pg (ref 26.0–34.0)
MCHC: 34.8 g/dL (ref 30.0–36.0)
MCV: 86.6 fL (ref 80.0–100.0)
Monocytes Absolute: 0.6 10*3/uL (ref 0.1–1.0)
Monocytes Relative: 10 %
Neutro Abs: 3.5 10*3/uL (ref 1.7–7.7)
Neutrophils Relative %: 61 %
Platelets: 351 10*3/uL (ref 150–400)
RBC: 4.32 MIL/uL (ref 3.87–5.11)
RDW: 13.1 % (ref 11.5–15.5)
WBC: 5.7 10*3/uL (ref 4.0–10.5)
nRBC: 0 % (ref 0.0–0.2)

## 2020-10-25 LAB — URINALYSIS, ROUTINE W REFLEX MICROSCOPIC
Bilirubin Urine: NEGATIVE
Glucose, UA: NEGATIVE mg/dL
Hgb urine dipstick: NEGATIVE
Ketones, ur: NEGATIVE mg/dL
Leukocytes,Ua: NEGATIVE
Nitrite: NEGATIVE
Protein, ur: NEGATIVE mg/dL
Specific Gravity, Urine: 1.015 (ref 1.005–1.030)
pH: 7.5 (ref 5.0–8.0)

## 2020-10-25 LAB — PREGNANCY, URINE: Preg Test, Ur: NEGATIVE

## 2020-10-25 LAB — LIPASE, BLOOD: Lipase: 51 U/L (ref 11–51)

## 2020-10-25 MED ORDER — MORPHINE SULFATE (PF) 4 MG/ML IV SOLN
4.0000 mg | Freq: Once | INTRAVENOUS | Status: AC
  Administered 2020-10-25: 4 mg via INTRAVENOUS
  Filled 2020-10-25: qty 1

## 2020-10-25 MED ORDER — ONDANSETRON HCL 4 MG/2ML IJ SOLN
4.0000 mg | Freq: Once | INTRAMUSCULAR | Status: AC
Start: 2020-10-25 — End: 2020-10-25
  Administered 2020-10-25: 4 mg via INTRAVENOUS
  Filled 2020-10-25: qty 2

## 2020-10-25 MED ORDER — IOHEXOL 300 MG/ML  SOLN
75.0000 mL | Freq: Once | INTRAMUSCULAR | Status: AC | PRN
  Administered 2020-10-25: 75 mL via INTRAVENOUS

## 2020-10-25 MED ORDER — SODIUM CHLORIDE 0.9 % IV BOLUS
1000.0000 mL | Freq: Once | INTRAVENOUS | Status: AC
Start: 2020-10-25 — End: 2020-10-25
  Administered 2020-10-25: 1000 mL via INTRAVENOUS

## 2020-10-25 NOTE — ED Triage Notes (Signed)
Pt c/o upper and lower abd pain x couple weeks

## 2020-10-25 NOTE — ED Provider Notes (Signed)
[facility] EMERGENCY DEPARTMENT Provider Note   CSN: [id number] Arrival date & time: 10/25/20  1101     History Chief Complaint  Patient presents with   Abdominal Pain   Alexandra Lopez is a 45 y.o. female with past medical history significant for GERD, H. pylori, ectopic pregnancy, tubal ligation, and tobacco abuse presents to emergency room today with chief complaint of abdominal pain x3 days.  Patient states she has had abdominal pain x3 weeks however it has progressively worsened over the last 3 days.  She states the pain initially started as a burning sensation in the middle of her abdomen.  She states pain is currently radiating across her lower quadrants.  She describes it as sharp.  She rates pain 9 out of 10 in severity.  She has been taking Advil PM without much symptom improvement.  She denies pain being worse with food.  She states she had H. pylori in 2018 and this might feel similar, she is unsure.  She has not GI in the past however has an appointment scheduled with [facility] medical GI this week for colonoscopy.  Patient admits to associated nausea without emesis.  She denies fever, chills, back pain, pelvic pain, vaginal discharge, abnormal vaginal bleeding, urinary symptoms, diarrhea.  Patient states she has not been sexually active in x18 months.  Her last bowel movement was this morning and was normal.    Past Medical History:  Diagnosis Date   GERD (gastroesophageal reflux disease)    H pylori ulcer    Tubal pregnancy     There are no problems to display for this patient.   Past Surgical History:  Procedure Laterality Date   ANKLE SURGERY     arm surgery     ECTOPIC PREGNANCY SURGERY     TUBAL LIGATION       OB History     Gravida  8   Para  5   Term  5   Preterm      AB  2   Living  5      SAB  1   IAB      Ectopic  1   Multiple      Live Births              No family history on file.  Social History   Tobacco Use    Smoking status: Every Day    Packs/day: 0.25    Types: Cigarettes   Smokeless tobacco: Never  Substance Use Topics   Alcohol use: No   Drug use: Yes    Types: Marijuana    Comment: occ    Home Medications Prior to Admission medications   Not on File    Allergies    Latex and Penicillins  Review of Systems   Review of Systems All other systems are reviewed and are negative for acute change except as noted in the HPI.  Physical Exam Updated Vital Signs BP 120/63 (BP Location: Right Arm)   Pulse 60   Temp 98.4 F (36.9 C) (Oral)   Resp 18   Ht 5\' 4"  (1.626 m)   Wt 61.2 kg   LMP 09/16/2020   SpO2 100%   BMI 23.17 kg/m   Physical Exam Vitals and nursing note reviewed.  Constitutional:      General: She is not in acute distress.    Appearance: She is not ill-appearing.  HENT:     Head: Normocephalic and atraumatic.  Right Ear: Tympanic membrane and external ear normal.     Left Ear: Tympanic membrane and external ear normal.     Nose: Nose normal.     Mouth/Throat:     Mouth: Mucous membranes are moist.     Pharynx: Oropharynx is clear.  Eyes:     General: No scleral icterus.       Right eye: No discharge.        Left eye: No discharge.     Extraocular Movements: Extraocular movements intact.     Conjunctiva/sclera: Conjunctivae normal.     Pupils: Pupils are equal, round, and reactive to light.  Neck:     Vascular: No JVD.  Cardiovascular:     Rate and Rhythm: Normal rate and regular rhythm.     Pulses: Normal pulses.          Radial pulses are 2+ on the right side and 2+ on the left side.     Heart sounds: Normal heart sounds.  Pulmonary:     Comments: Lungs clear to auscultation in all fields. Symmetric chest rise. No wheezing, rales, or rhonchi. Abdominal:     General: Bowel sounds are normal.     Tenderness: There is no right CVA tenderness or left CVA tenderness.     Comments: Abdomen is soft, non-distended.  Tender to palpation of left lower  quadrant and suprapubic area.  No rigidity, no guarding. No peritoneal signs.  Musculoskeletal:        General: Normal range of motion.     Cervical back: Normal range of motion.  Skin:    General: Skin is warm and dry.     Capillary Refill: Capillary refill takes less than 2 seconds.  Neurological:     Mental Status: She is oriented to person, place, and time.     GCS: GCS eye subscore is 4. GCS verbal subscore is 5. GCS motor subscore is 6.     Comments: Fluent speech, no facial droop.  Psychiatric:        Behavior: Behavior normal.    ED Results / Procedures / Treatments   Labs (all labs ordered are listed, but only abnormal results are displayed) Labs Reviewed  URINALYSIS, ROUTINE W REFLEX MICROSCOPIC - Abnormal; Notable for the following components:      Result Value   APPearance HAZY (*)    All other components within normal limits  CBC WITH DIFFERENTIAL/PLATELET  COMPREHENSIVE METABOLIC PANEL  LIPASE, BLOOD  PREGNANCY, URINE    EKG None  Radiology CT Abdomen Pelvis W Contrast  Result Date: 10/25/2020 CLINICAL DATA:  Left lower quadrant pain EXAM: CT ABDOMEN AND PELVIS WITH CONTRAST TECHNIQUE: Multidetector CT imaging of the abdomen and pelvis was performed using the standard protocol following bolus administration of intravenous contrast. CONTRAST:  41mL OMNIPAQUE IOHEXOL 300 MG/ML  SOLN COMPARISON:  None available FINDINGS: Lower chest: No pneumothorax or pleural effusion. Minimal dependent atelectasis in the lung bases. Hepatobiliary: No focal liver abnormality is seen. No gallstones, gallbladder wall thickening, or biliary dilatation. Pancreas: Unremarkable. No pancreatic ductal dilatation or surrounding inflammatory changes. Spleen: Normal in size without focal abnormality. Adrenals/Urinary Tract: Adrenal glands are unremarkable. Kidneys are normal, without renal calculi, focal lesion, or hydronephrosis. Bladder is unremarkable. Stomach/Bowel: Stomach is nondistended.  Small bowel decompressed. Normal appendix. The colon is nondilated, unremarkable. Vascular/Lymphatic: Dilated refluxing left ovarian vein with multiple enhancing left paraovarian veins. No abdominal or pelvic adenopathy. Reproductive: Right tubal ligation clip. Dilated left paraovarian venous structures supplied by  refluxing dilated gonadal vein. Other: Left pelvic phleboliths.  No ascites.  No free air. Musculoskeletal: No acute or significant osseous findings. IMPRESSION: 1. No acute findings. 2. Dilated refluxing left ovarian vein supplying prominent paraovarian veins. Correlate with any clinical evidence of pelvic congestion syndrome. Electronically Signed   By: Corlis Leak M.D.   On: 10/25/2020 13:28    Procedures Procedures   Medications Ordered in ED Medications  morphine 4 MG/ML injection 4 mg (4 mg Intravenous Given 10/25/20 1209)  ondansetron (ZOFRAN) injection 4 mg (4 mg Intravenous Given 10/25/20 1209)  sodium chloride 0.9 % bolus 1,000 mL (1,000 mLs Intravenous New Bag/Given 10/25/20 1205)  iohexol (OMNIPAQUE) 300 MG/ML solution 75 mL (75 mLs Intravenous Contrast Given 10/25/20 1300)    ED Course  I have reviewed the triage vital signs and the nursing notes.  Pertinent labs & imaging results that were available during my care of the patient were reviewed by me and considered in my medical decision making (see chart for details).    MDM Rules/Calculators/A&P                           History provided by patient with additional history obtained from chart review.    Patient presents to the ED with complaints of abdominal pain. Patient nontoxic appearing, in no apparent distress, vitals WNL . On exam patient tender to LLQ and suprapubic region, no peritoneal signs. Will evaluate with labs and CT AP given tenderness. Analgesics, anti-emetics, and fluids administered.   Labs reviewed and grossly unremarkable. No leukocytosis, no anemia, no significant electrolyte derangements. LFTs,  renal function, and lipase WNL. Urinalysis without obvious infection.  Imaging shows dilated refluxing left ovarian vein supplying prominent paraovarian veins. This could be  evidence of pelvic congestion syndrome.  Discussed results with patient.  She is stable for outpatient follow-up with GYN for further evaluation of this.  Patient is agreeable with plan of care. Patient tolerating PO in the emergency department. Will discharge home with supportive measures. I discussed results, treatment plan, need for PCP follow-up, and return precautions with the patient.   Portions of this note were generated with Scientist, clinical (histocompatibility and immunogenetics). Dictation errors may occur despite best attempts at proofreading.    Final Clinical Impression(s) / ED Diagnoses Final diagnoses:  Pelvic pain in female    Rx / DC Orders ED Discharge Orders     None        Kandice Hams 10/25/20 1408    Gwyneth Sprout, MD 10/25/20 1521

## 2020-10-25 NOTE — Discharge Instructions (Addendum)
CT scan shows: Dilated refluxing left ovarian vein supplying prominent paraovarian veins. This could be pelvic congestion syndrome.   Recommend you take Tylenol and ibuprofen for pain at home.  Follow-up with your GYN doctor.  Give them a call Monday and let them know you need an ER follow-up appointment.

## 2020-11-26 ENCOUNTER — Emergency Department (HOSPITAL_BASED_OUTPATIENT_CLINIC_OR_DEPARTMENT_OTHER)
Admission: EM | Admit: 2020-11-26 | Discharge: 2020-11-26 | Disposition: A | Discharge status: Home / Self Care | Payer: Medicaid Other | Attending: Emergency Medicine | Admitting: Emergency Medicine

## 2020-11-26 ENCOUNTER — Encounter (HOSPITAL_BASED_OUTPATIENT_CLINIC_OR_DEPARTMENT_OTHER): Payer: Self-pay | Admitting: *Deleted

## 2020-11-26 ENCOUNTER — Other Ambulatory Visit: Payer: Self-pay

## 2020-11-26 DIAGNOSIS — H9201 Otalgia, right ear: Secondary | ICD-10-CM | POA: Diagnosis not present

## 2020-11-26 DIAGNOSIS — R0981 Nasal congestion: Secondary | ICD-10-CM | POA: Diagnosis not present

## 2020-11-26 DIAGNOSIS — Z91040 Latex allergy status: Secondary | ICD-10-CM | POA: Diagnosis not present

## 2020-11-26 DIAGNOSIS — F17210 Nicotine dependence, cigarettes, uncomplicated: Secondary | ICD-10-CM | POA: Diagnosis not present

## 2020-11-26 MED ORDER — FLUTICASONE PROPIONATE 50 MCG/ACT NA SUSP: 2.0000 | Freq: Every day | NASAL | 2 refills | Status: AC

## 2020-11-26 NOTE — ED Triage Notes (Signed)
C/o right ear pain x 3 days

## 2020-11-26 NOTE — Discharge Instructions (Addendum)
Take Claritin, Zyrtec or Benadryl.  Use the Flonase twice daily for the next week.  Follow-up with your primary care doctor if no improvement.  You may need to find a way to limit the source of your allergy exacerbation.  If you develop a fever please return back for additional evaluation.

## 2020-11-26 NOTE — ED Provider Notes (Signed)
[facility] EMERGENCY DEPARTMENT Provider Note   CSN: [id number] Arrival date & time: 11/26/20  1422     History Chief Complaint  Patient presents with   Ear Pain    Alexandra Lopez is a 45 y.o. female.  HPI  Patient presents with right ear pain.  The pain is constant, associated with nasal congestion.  No drainage, no muffled hearing.  No recent antibiotic use.  Started acutely with at her employer's house.  Patient has allergies to the scented product.  She has no history of these allergies.  Past Medical History:  Diagnosis Date   GERD (gastroesophageal reflux disease)    H pylori ulcer    Tubal pregnancy     There are no problems to display for this patient.   Past Surgical History:  Procedure Laterality Date   ANKLE SURGERY     arm surgery     ECTOPIC PREGNANCY SURGERY     TUBAL LIGATION       OB History     Gravida  8   Para  5   Term  5   Preterm      AB  2   Living  5      SAB  1   IAB      Ectopic  1   Multiple      Live Births              No family history on file.  Social History   Tobacco Use   Smoking status: Every Day    Packs/day: 0.25    Types: Cigarettes   Smokeless tobacco: Never  Substance Use Topics   Alcohol use: No   Drug use: Yes    Types: Marijuana    Comment: occ    Home Medications Prior to Admission medications   Not on File    Allergies    Latex, Other, and Penicillins  Review of Systems   Review of Systems  Constitutional:  Negative for fever.  HENT:  Positive for congestion and ear pain.    Physical Exam Updated Vital Signs BP 106/81   Pulse 71   Temp 98.9 F (37.2 C) (Oral)   Resp 16   Ht 5\' 4"  (1.626 m)   Wt 62.1 kg   LMP 11/16/2020   SpO2 99%   BMI 23.52 kg/m   Physical Exam Vitals and nursing note reviewed. Exam conducted with a chaperone present.  Constitutional:      General: She is not in acute distress.    Appearance: Normal appearance.  HENT:     Head:  Normocephalic and atraumatic.     Right Ear: Tympanic membrane normal.     Left Ear: Tympanic membrane normal.     Ears:     Comments: TM is retracted bilaterally.  No fluid behind the membrane, no erythema, no debris.  No perforation.    Nose: Congestion present.     Comments: Pale turbinates Eyes:     General: No scleral icterus.    Extraocular Movements: Extraocular movements intact.     Pupils: Pupils are equal, round, and reactive to light.  Skin:    Coloration: Skin is not jaundiced.  Neurological:     Mental Status: She is alert. Mental status is at baseline.     Coordination: Coordination normal.    ED Results / Procedures / Treatments   Labs (all labs ordered are listed, but only abnormal results are displayed) Labs Reviewed - No data  to display  EKG None  Radiology No results found.  Procedures Procedures   Medications Ordered in ED Medications - No data to display  ED Course  I have reviewed the triage vital signs and the nursing notes.  Pertinent labs & imaging results that were available during my care of the patient were reviewed by me and considered in my medical decision making (see chart for details).    MDM Rules/Calculators/A&P                           Patient vitals are stable, not febrile.  Bilaterally has retracted TM.  No bulging membrane, no perforation, no signs of necrotizing otitis externa.  Patient is stable, appropriate for discharge.  We will start her on Flonase and antihistamine medicine.  Final Clinical Impression(s) / ED Diagnoses Final diagnoses:  None    Rx / DC Orders ED Discharge Orders     None        Theron Arista, [place] 11/26/20 1559    Milagros Loll, MD 11/30/20 1017

## 2022-07-22 IMAGING — CT CT ABD-PELV W/ CM
2 of 5 series · 16 of 46 positions shown, 18 images · IV contrast (Omnipaque)
Comparison: None available

CLINICAL DATA: Left lower quadrant pain

EXAM:
CT ABDOMEN AND PELVIS WITH CONTRAST
TECHNIQUE: Multidetector CT imaging of the abdomen and pelvis was performed
using the standard protocol following bolus administration of
intravenous contrast.
CONTRAST:  75mL OMNIPAQUE IOHEXOL 300 MG/ML  SOLN

[Series 2: axial st · axial · 0.78mm/px · z∈[+751,+1121]mm · 13 of 84 slices shown, 15 images]
[im 5/84  soft-tissue]
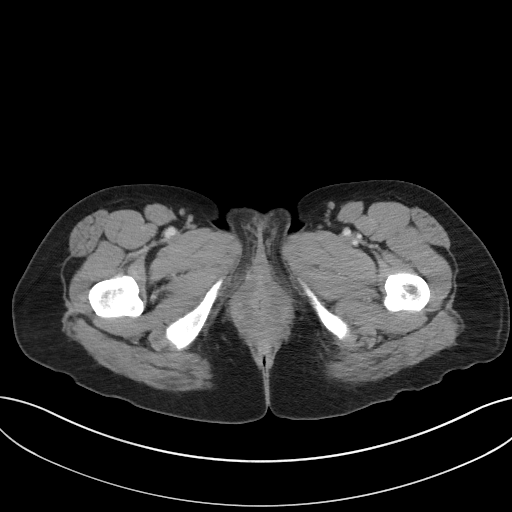
[im 5/84  bone]
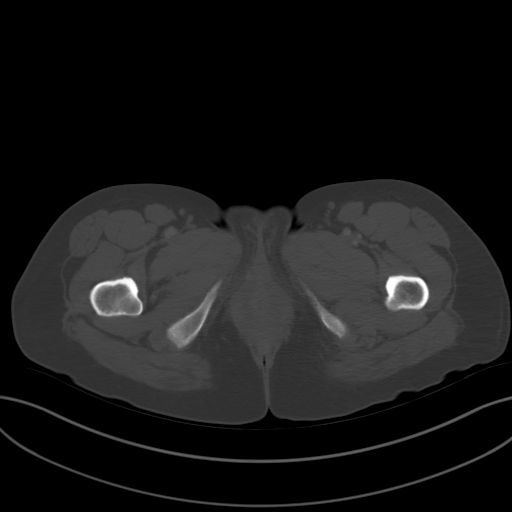
[im 14/84  soft-tissue]
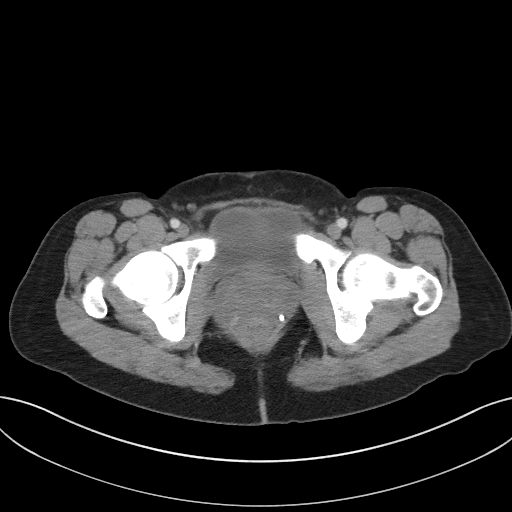
[im 18/84  soft-tissue]
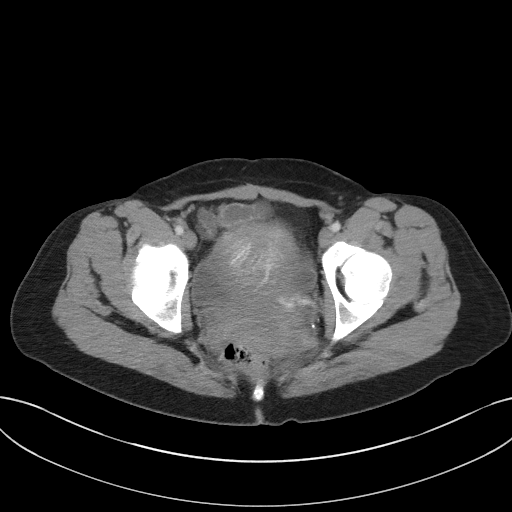
[im 22/84  soft-tissue]
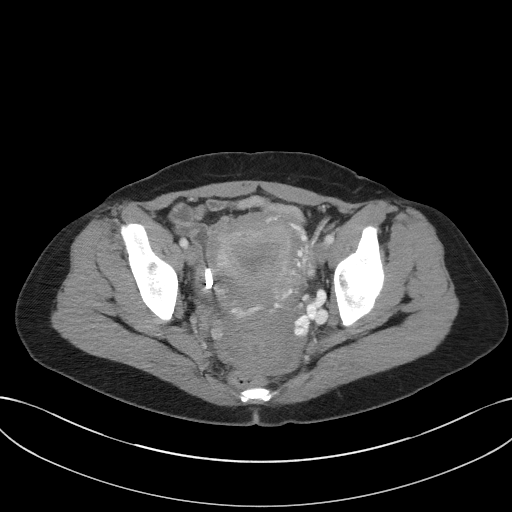
[im 31/84  soft-tissue]
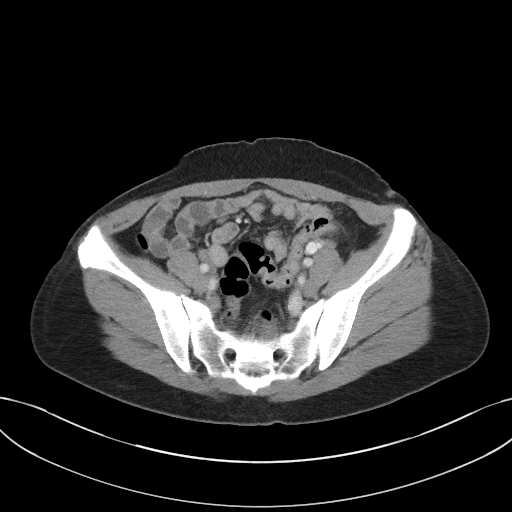
[im 35/84  soft-tissue]
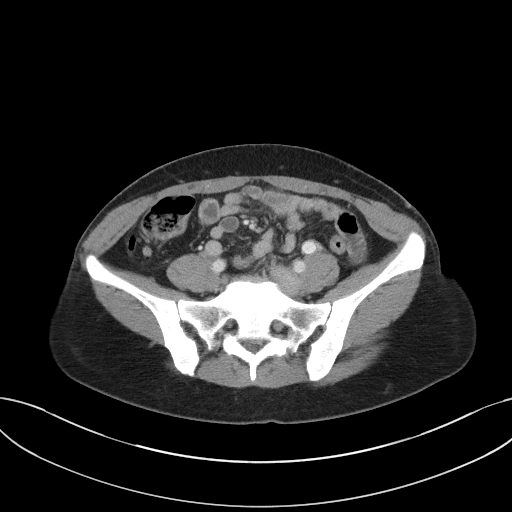
[im 44/84  soft-tissue]
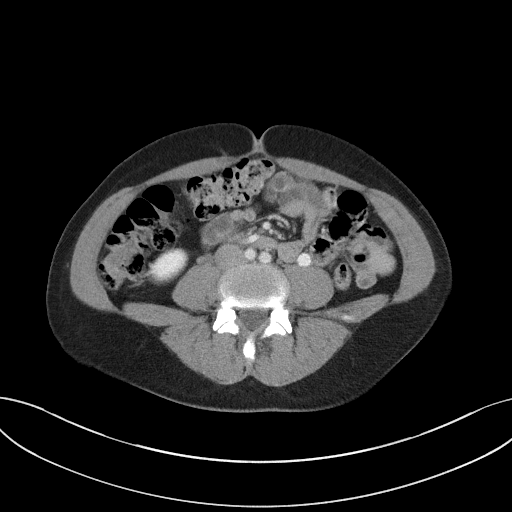
[im 49/84  soft-tissue]
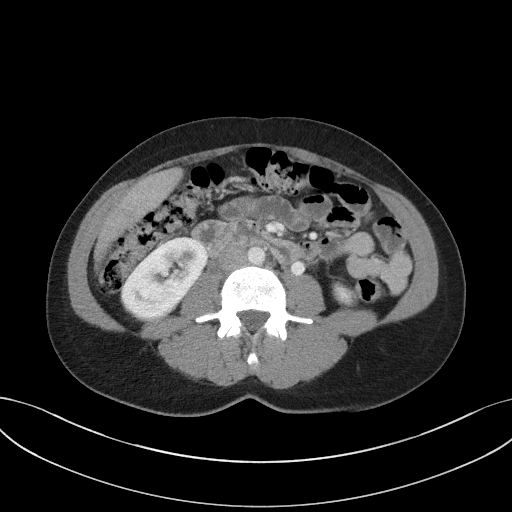
[im 53/84  soft-tissue]
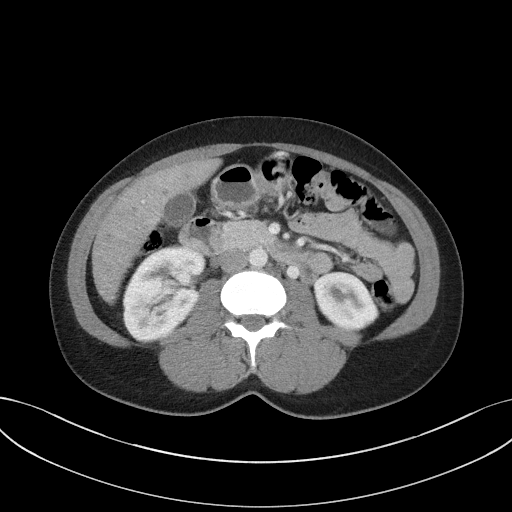
[im 53/84  bone]
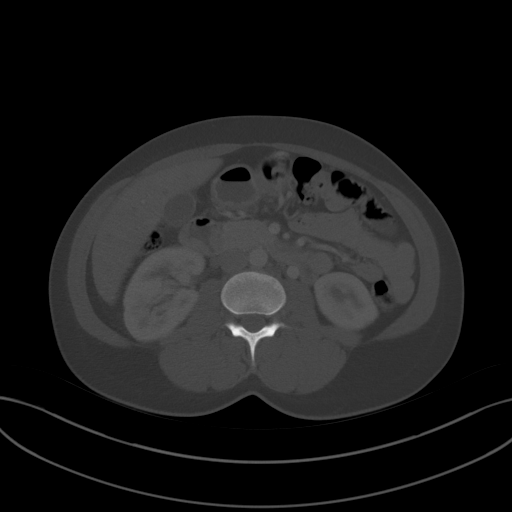
[im 62/84  soft-tissue]
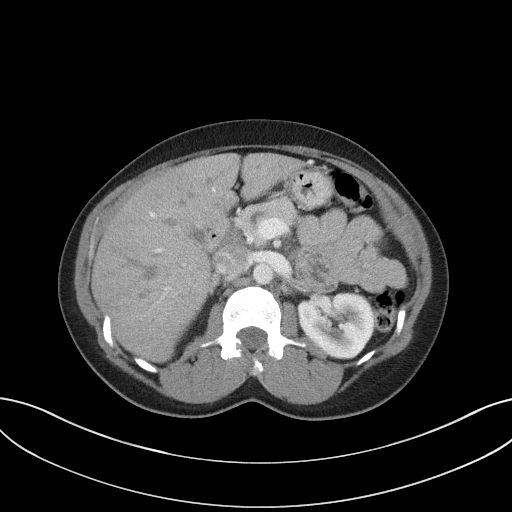
[im 66/84  soft-tissue]
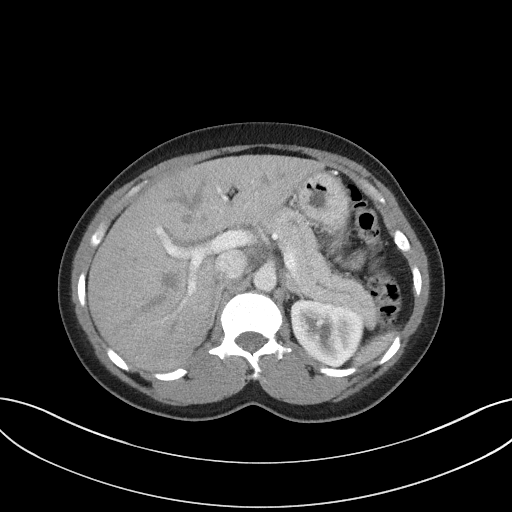
[im 70/84  soft-tissue]
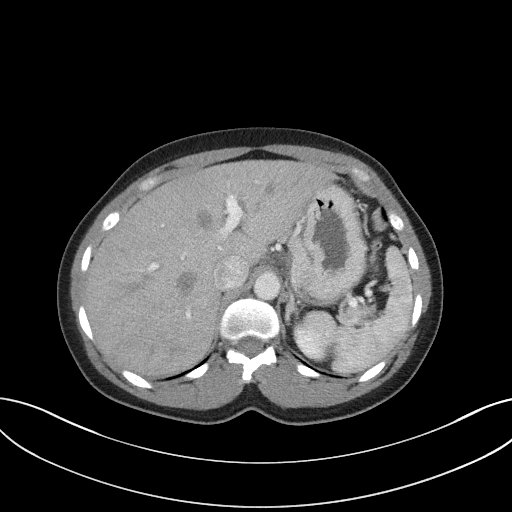
[im 79/84  soft-tissue]
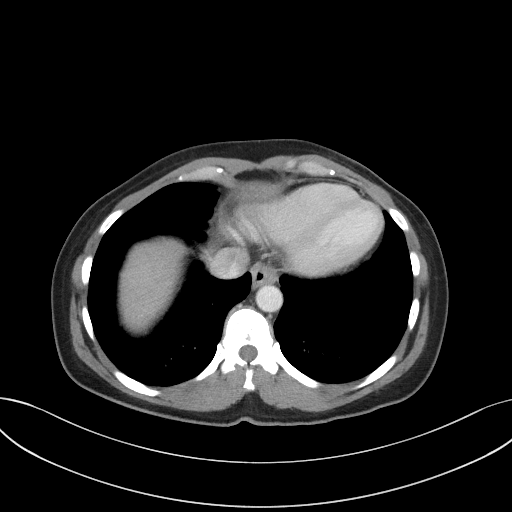

[Series 5: coronal st · coronal · 0.78mm/px · 3 of 85 slices shown]
[im 29/85  soft-tissue]
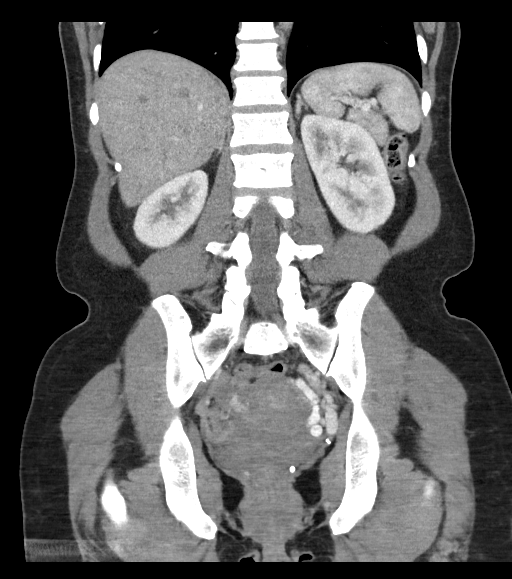
[im 38/85  soft-tissue]
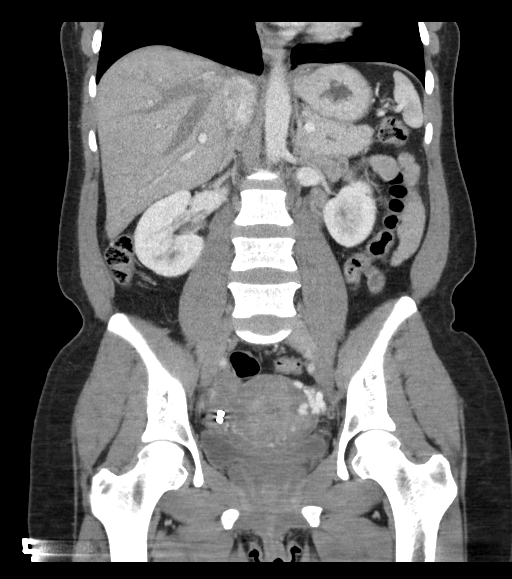
[im 47/85  soft-tissue]
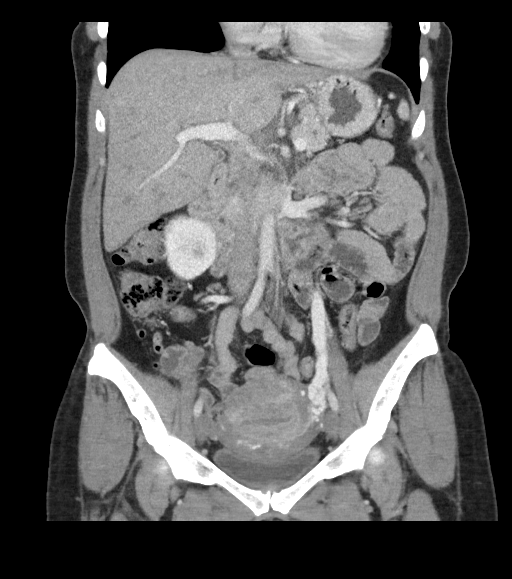

[16 of 46 positions shown; findings below may reference images not displayed]

FINDINGS: Lower chest: No pneumothorax or pleural effusion. Minimal dependent
atelectasis in the lung bases.

Hepatobiliary: No focal liver abnormality is seen. No gallstones,
gallbladder wall thickening, or biliary dilatation.

Pancreas: Unremarkable. No pancreatic ductal dilatation or
surrounding inflammatory changes.

Spleen: Normal in size without focal abnormality.

Adrenals/Urinary Tract: Adrenal glands are unremarkable. Kidneys are
normal, without renal calculi, focal lesion, or hydronephrosis.
Bladder is unremarkable.

Stomach/Bowel: Stomach is nondistended. Small bowel decompressed.
Normal appendix. The colon is nondilated, unremarkable.

Vascular/Lymphatic: Dilated refluxing left ovarian vein with
multiple enhancing left paraovarian veins. No abdominal or pelvic
adenopathy.

Reproductive: Right tubal ligation clip. Dilated left paraovarian
venous structures supplied by refluxing dilated gonadal vein.

Other: Left pelvic phleboliths.  No ascites.  No free air.

Musculoskeletal: No acute or significant osseous findings.
IMPRESSION: 1. No acute findings.
2. Dilated refluxing left ovarian vein supplying prominent
paraovarian veins. Correlate with any clinical evidence of pelvic
congestion syndrome.

## 2023-08-01 NOTE — Progress Notes (Signed)
 Otolaryngology New Patient Note  Subjective: Ms. Alexandra Lopez is a 48 y.o. 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  She also has a history of LPR.  She returns today for evaluation of fluctuating dysphonia. Symptoms have been intermittently present for over a year but seem to be worsening.  She denies odynophagia or dysphagia.  Denies neck masses.  Denies throat pain.  She has a significant GERD history and reports daily symptoms.  She has been prescribed Prilosec 20 mg daily, which provided little relief and was thus discontinued.  Gastroenterology workup is in progress.  Past Medical History:  Diagnosis Date  . Abnormal Pap smear of cervix    Pt states she had one yrs ago that reverted to normal no Colpo done  . Acid reflux    SEVERE  . Anxiety    Hx of anxiety in the past and was on medication  . Ectopic pregnancy (CMD)   . Gestational diabetes (CMD)    PAST  . Heart murmur    Pt states she has a heart murmur   . Substance abuse (CMD)    Was smoking Marijuana but stopped a week ago   Past Surgical History:  Procedure Laterality Date  . ANKLE ARTHRODESIS Right 02/10/2016   Procedure: ARTHRODESIS SUBTALAR ;  Surgeon: Lamar JONETTA Grain, MD;  Location: [facility] OUTPATIENT OR;  Service: Orthopedics;  Laterality: Right;  . ANKLE HARDWARE REMOVAL Right 02/10/2016   Procedure: HARDWARE REMOVAL ANKLE;  Surgeon: Lamar JONETTA Grain, MD;  Location: [facility] OUTPATIENT OR;  Service: Orthopedics;  Laterality: Right;  . FINGER GANGLION CYST EXCISION Left 05/25/2020   Procedure: EXCISION LEFT THUMB GLOMUS TUMOR;  Surgeon: Ozell Gwenn Hock, MD;  Location: [facility] OR;  Service: Plastics;  Laterality: Left;  . ILIAC CREST BONE GRAFT Right 02/10/2016   Procedure: ILIAC CREST BONE GRAFT;  Surgeon: Lamar JONETTA Grain, MD;  Location: [facility] OUTPATIENT OR;  Service: Orthopedics;  Laterality: Right;  . LAPAROSCOPIC TUBAL LIGATION Right 04/27/2018    Procedure: LAPAROSCOPIC TUBAL LIGATION;  Surgeon: Kate Rosaline Bellman, MD;  Location: [facility] OUTPATIENT OR;  Service: Gynecology;  Laterality: Right;  Filshie clipsx 2  on right fallopian tube; no clips on left tube  . LARYNGOSCOPY N/A 05/14/2019   Procedure: DIRECT LARYNGOSCOPY WITH ENDOSCOPE WITH EXCISION TUMOR;  Surgeon: Alm Edsel Georgina Mickey., MD;  Location: [facility] OUTPATIENT OR;  Service: ENT;  Laterality: N/A;  . ORIF TALUS FRACTURE Right 07/07/2014   Procedure: OPEN TREATMENT TALUS FRACTURE;  Surgeon: Therisa Marcus Pinal, MD;  Location: [facility] MAIN OR;  Service: Orthopedics;  Laterality: Right;  . OTHER SURGICAL HISTORY  20 YEARS AGO   Procedure: OTHER SURGICAL HISTORY (nodule vocal cord)  . OTHER SURGICAL HISTORY     Procedure: OTHER SURGICAL HISTORY (tendon repair left forearm)  . OTHER SURGICAL HISTORY     Procedure: OTHER SURGICAL HISTORY (left fallopian tube removed)  . OVARIAN CYST DRAINAGE Left 04/27/2018   Procedure: OVARIAN CYST ASPIRATION LAPAROSCOPIC;  Surgeon: Kate Rosaline Bellman, MD;  Location: [facility] OUTPATIENT OR;  Service: Gynecology;  Laterality: Left;  . TISSUE REARRANGEMENT Left 05/25/2020   Procedure: POSSIBLE LOCAL TISSUE REARRANGEMENT;  Surgeon: Ozell Gwenn Hock, MD;  Location: [facility];  Service: Plastics;  Laterality: Left;   Family History  Problem Relation Name Age of Onset  . Headaches Mother    . Cancer Father         colon  . Diabetes  Sister    . Diabetes Paternal Grandmother    . Anesthesia problems Neg Hx    . Arthritis Neg Hx    . Asthma Neg Hx    . Cerebral palsy Neg Hx    . Clotting disorder Neg Hx    . Club foot Neg Hx    . Collagen disease Neg Hx    . Gait disorder Neg Hx    . Gout Neg Hx    . Heart disease Neg Hx    . Hip dysplasia Neg Hx    . Hip fracture Neg Hx    . Hypermobility Neg Hx    . Hypertension Neg Hx    . Osteoporosis Neg Hx    . Other Neg Hx    . Pulmonary disease Neg Hx    . Rheumatologic disease Neg Hx    .  Scoliosis Neg Hx    . Spina bifida Neg Hx    . Stroke Neg Hx    . Thyroid disease Neg Hx    . Vasculitis Neg Hx    . Breast cancer Neg Hx       Allergies  Allergen Reactions  . Perfume Hives, Itching, Rash, Shortness Of Breath and Swelling  . Penicillin Swelling  . Latex Rash     ROS A complete review of systems was conducted and was negative except as stated in the HPI.   Objective: Vitals:   08/01/23 1522  BP: (!) 164/94  Pulse: 63  Temp: 97.8 F (36.6 C)   Physical Exam:   General Optima/AT. No suspicious cutaneous lesions or ulcerations. No tenderness to palpation over the frontal or maxillary sinuses.  Awake, Alert and appropriate for the exam  Eyes PERRL, no scleral icterus or conjunctival hemorrhage.  EOMI.  Ears Right ear- EAC patent, no obstructing cerumen. TM: intact, no effusion, no retraction, normal landmarks. Left ear- EAC patent, no obstructing cerumen. TM: intact, no effusion, no retraction, normal landmarks.  Nose No external nasal deformities. Septum straight. Nasal mucosa moist. No significant turbinate hypertrophy. No masses or polyps.   Oral Pharynx Mucosa moist and clear. No masses, lesions, or ulceration noted on the lips, alveolar ridges, buccal mucosa, floor of mouth, tongue, soft/hard palate, tonsils, or posterior pharynx. Soft FOM. Mobile tongue.  Neck/Lymphatics Soft and supple without LAD. Palpation of the salivary glands demonstrates no masses or irregularity.  Endocrine Thyroid normal to palpation without dominant nodule or irregularity.  Cardio-vascular No cyanosis. Regular rate and rhythm.  Pulmonary Quiet respirations with symmetric chest wall movement. Lungs are clear to auscultation bilaterally   Neuro CN II-XII intact and symmetric. V1-3 intact and symmetric. Symmetric facial movement. Palate elevates symmetrically. Tongue midline   Psychiatry Appropriate affect and mood for clinic visit.  Voice Normal without hoarseness or breathiness      Laryngoscopy Flexible Diagnostic  Date/Time: 08/01/2023 3:15 PM  Performed by: Oliva Elsie Rily, MD Authorized by: Oliva Elsie Rily, MD   Comments:     PROCEDURE: After verbal consent was obtained, the nasal cavities were sprayed with a combination of Afrin and Xylocaine. A flexible endoscope was then passed into the nasal cavities.  NASAL CAVITIES: No nasal masses or polyps were visualized.  NASOPHARYNX: The nasopharynx was clear. Eustachian tube orifice clear and patent bilaterally.  OROPHARYNX: Tonsils were symmetric and non-obstructing. The vallecula and BOT were clear with no evidence of mass, lesion, or asymmetry.  SUPRAGLOTTIS: The epiglottis and false vocal cords appeared normal.  Arytenoids demonstrated mucosal edema and  interarytenoid pachydermia.  HYPOPHARYNX: The piriform sinuses and post-cricoid regions were clear.  LARYNX/GLOTTIS: The true vocal cords were fully mobile.  Diffuse edema of the vocal cords bilaterally.  They are otherwise symmetric without nodules, ulcerations, or lesions.  The procedure was performed without complication and was well-tolerated by the patient.    Assessment:  Alexandra Lopez presents today with  1. Laryngopharyngeal reflux (LPR)   2. Dysphonia    48 year old female who presents for evaluation of intermittent, fluctuating dysphonia.  Complete examination of the upper aerodigestive tract demonstrates no mass, lesion, or ulceration.  The vocal cords demonstrate diffuse edema without evidence of nodule, polyp, or ulceration.  Vocal cords are mobile bilaterally.  Endoscopic findings are suggestive of GER/LPR as the underlying source of her dysphonia.  Gastroenterology workup is in progress, so will defer definitive medical management pending completion of her evaluation.  Plan:  As above.  Return to clinic as needed with questions or concerns.   Oliva MICAEL Rily, MD Otolaryngology - Head & Neck Surgery ENT & Audiology - [facility]    Electronically signed by: Oliva Elsie Rily, MD 08/02/2023 10:41 AM

## 2023-11-02 ENCOUNTER — Other Ambulatory Visit: Payer: Self-pay

## 2023-11-02 ENCOUNTER — Emergency Department (HOSPITAL_BASED_OUTPATIENT_CLINIC_OR_DEPARTMENT_OTHER)
Admission: EM | Admit: 2023-11-02 | Discharge: 2023-11-02 | Disposition: A | Discharge status: Home / Self Care | Attending: Emergency Medicine | Admitting: Emergency Medicine

## 2023-11-02 ENCOUNTER — Emergency Department (HOSPITAL_BASED_OUTPATIENT_CLINIC_OR_DEPARTMENT_OTHER)

## 2023-11-02 ENCOUNTER — Encounter (HOSPITAL_BASED_OUTPATIENT_CLINIC_OR_DEPARTMENT_OTHER): Payer: Self-pay | Admitting: Emergency Medicine

## 2023-11-02 DIAGNOSIS — Z91040 Latex allergy status: Secondary | ICD-10-CM | POA: Insufficient documentation

## 2023-11-02 DIAGNOSIS — M79671 Pain in right foot: Secondary | ICD-10-CM | POA: Diagnosis not present

## 2023-11-02 DIAGNOSIS — M79604 Pain in right leg: Secondary | ICD-10-CM | POA: Insufficient documentation

## 2023-11-02 NOTE — ED Notes (Signed)
 Patient given discharge instructions. Questions were answered. Patient verbalized understanding of discharge instructions and care at home.

## 2023-11-02 NOTE — ED Triage Notes (Signed)
 Pt c/o R foot pain, notes new knot to top of foot, also site of concern shin where it feels soft, and real hot intermittent x 1 year.   Hx of talus reconstruction. Denies recent injury.

## 2023-11-02 NOTE — ED Provider Notes (Signed)
 [facility] EMERGENCY DEPARTMENT AT [facility] [place] Provider Note   CSN: [id number] Arrival date & time: 11/02/23  1749     Patient presents with: Foot Pain   Alexandra Lopez is a 48 y.o. female.   The history is provided by the patient and medical records. No language interpreter was used.  Foot Pain This is a new problem. The current episode started more than 1 week ago. The problem occurs constantly. The problem has not changed since onset.Pertinent negatives include no chest pain, no abdominal pain, no headaches and no shortness of breath. The symptoms are aggravated by walking. Nothing relieves the symptoms. She has tried nothing for the symptoms. The treatment provided no relief.       Prior to Admission medications   Medication Sig Start Date End Date Taking? Authorizing Provider  fluticasone  (FLONASE ) 50 MCG/ACT nasal spray Place 2 sprays into both nostrils daily. 11/26/20   Emelia Sluder, PA-C    Allergies: Latex, Other, and Penicillins    Review of Systems  Constitutional:  Negative for chills, fatigue and fever.  HENT:  Negative for congestion.   Respiratory:  Negative for cough, chest tightness, shortness of breath and wheezing.   Cardiovascular:  Negative for chest pain, palpitations and leg swelling.  Gastrointestinal:  Negative for abdominal pain, constipation, diarrhea, nausea and vomiting.  Genitourinary:  Negative for dysuria, flank pain and frequency.  Musculoskeletal:  Negative for back pain, neck pain and neck stiffness.  Skin:  Negative for rash and wound.  Neurological:  Negative for weakness, light-headedness and headaches.  Psychiatric/Behavioral:  Negative for agitation and confusion.   All other systems reviewed and are negative.   Updated Vital Signs BP 118/82 (BP Location: Left Arm)   Pulse 81   Temp 98.5 F (36.9 C)   Resp 16   Ht 5' 4 (1.626 m)   Wt 65.3 kg   LMP 10/03/2023 (Approximate)   SpO2 96%   Breastfeeding No   BMI 24.72  kg/m   Physical Exam Vitals and nursing note reviewed.  Constitutional:      General: She is not in acute distress.    Appearance: She is well-developed. She is not ill-appearing, toxic-appearing or diaphoretic.  HENT:     Head: Normocephalic and atraumatic.     Right Ear: External ear normal.     Left Ear: External ear normal.     Nose: Nose normal. No congestion or rhinorrhea.     Mouth/Throat:     Mouth: Mucous membranes are moist.     Pharynx: No oropharyngeal exudate.  Eyes:     Conjunctiva/sclera: Conjunctivae normal.     Pupils: Pupils are equal, round, and reactive to light.  Cardiovascular:     Rate and Rhythm: Normal rate.     Pulses: Normal pulses.  Pulmonary:     Effort: No respiratory distress.     Breath sounds: No stridor.  Abdominal:     General: There is no distension.     Tenderness: There is no abdominal tenderness. There is no rebound.  Musculoskeletal:        General: Tenderness present. No swelling or signs of injury.     Cervical back: Normal range of motion and neck supple.     Right lower leg: No edema.     Left lower leg: No edema.     Comments: Patient has an indentation on the medial side of her right dorsum of the foot and a small bump on the lateral dorsum  of the right foot.  Minimal tenderness there.  No erythema.  No drainage.  No fluctuance or crepitance.  Intact sensation strength and pulse in the foot.  No tenderness on the shin but she has a small indentation on the mid right lateral shin that she is concerned about.  No crepitance.  Skin:    General: Skin is warm.     Capillary Refill: Capillary refill takes less than 2 seconds.     Coloration: Skin is not pale.     Findings: No bruising, erythema or rash.  Neurological:     Mental Status: She is alert and oriented to person, place, and time.     Motor: No abnormal muscle tone.     Coordination: Coordination normal.     Deep Tendon Reflexes: Reflexes are normal and symmetric.      (all labs ordered are listed, but only abnormal results are displayed) Labs Reviewed - No data to display  EKG: None  Radiology: DG Foot Complete Right Result Date: 11/02/2023 CLINICAL DATA:  Right foot pain. EXAM: RIGHT FOOT COMPLETE - 3+ VIEW COMPARISON:  None Available. FINDINGS: Status post surgical fusion of the talocalcaneal joint. No acute fracture or dislocation is noted. Remaining joint spaces are unremarkable. Linear density is seen in plantar soft tissues on lateral projection concerning for possible foreign body. IMPRESSION: Possible linear foreign body seen in plantar soft tissues on lateral projection. No acute fracture or dislocation is noted. Electronically Signed   By: Lynwood Landy Raddle M.D.   On: 11/02/2023 18:20     Procedures   Medications Ordered in the ED - No data to display                                  Medical Decision Making Amount and/or Complexity of Data Reviewed Radiology: ordered.    Alexandra Lopez is a 48 y.o. female with a past medical history significant for GERD, and previous right foot surgery who presents with right foot changes and some right shin pain.  According to patient, for months she has had some changes to her right foot where she sees a bump that is new on the dorsum and laterally and an indentation on the dorsum medially on the right foot.  She has had surgery there before but denies new trauma.  She says that she also noticed an indentation on her mid right shin laterally has some soreness and tenderness.  She denies any other significant leg swelling but she is worried about blood clots.  No family history reported of blood clots and no trauma otherwise.  She says this is gone for quite some time.  She denies any fevers, chills, chest pain, shortness breath, nausea, vomiting, or other complaints.  On exam, lungs clear and chest nontender.  Abdomen nontender.  Hips nontender.  Knees nontender.  She had some slight tenderness on her  mid lateral soft tissues in the shin and there is small indentation that she reports is new.  There was no significant other calf tenderness or swelling.  Ankle is nontender initially.  Intact sensation strength and pulse in the foot.  No skin changes otherwise.  No rashes or crepitance or fluctuance.  No cuts.  We had a shared decision-making conversation about management.  She had an x-ray of the foot that shows her hardware but no evidence of acute fracture or hardware shifting.  There was a possible foreign  body but she denies any pain on the bottom of her foot suspect this is old.  We given her concern for DVT and the calf discomfort, we agreed to get ultrasound however there is no ultrasound technician here now.  We will order an ultrasound for her to return to the ED to get.  We discussed the possibility of starting blood thinners but given her extremely low suspicion and suspect this being more musculoskeletal we agreed to hold on thinners at this time.  Patient agrees.  She also does not want a medicines and will call to follow-up with orthopedics foot doctor.   Clinically I suspect she may have a muscular injury or muscular tear causing the indentation and causing the bulge in the foot.  Do not suspect infection and patient is otherwise well-appearing with reassuring exam.  Patient agrees with plan of care and understands return precautions.  She will come back for the ultrasound.  Patient discharged in good condition for outpatient follow-up          Final diagnoses:  Right leg pain  Right foot pain    ED Discharge Orders          Ordered    US  Venous Img Lower Unilateral Right        11/02/23 1833           Clinical Impression: 1. Right leg pain   2. Right foot pain     Disposition: Discharge  Condition: Good  I have discussed the results, Dx and Tx plan with the pt(& family if present). He/she/they expressed understanding and agree(s) with the plan. Discharge  instructions discussed at great length. Strict return precautions discussed and pt &/or family have verbalized understanding of the instructions. No further questions at time of discharge.    New Prescriptions   No medications on file    Follow Up: Beverley Norleen NOVAK, MD [address] [place] [address] [phone]     your foot doctor         Demerius Podolak, Lonni PARAS, MD 11/02/23 [phone]

## 2023-11-02 NOTE — Discharge Instructions (Addendum)
 Your history and exam today did not seem consistent with acute fracture in the shin as there was no trauma and the x-ray of the foot did not show acute hardware problem.  We do not suspect infection based on your symptoms and I suspect this is more musculoskeletal pain.  As you were concerned about a blood clot, we agreed to order a DVT ultrasound but they have already left for the day so you need to return in the morning to get the ultrasound.  We had a shared decision-making conversation and agreed to hold on extensive other lab or imaging testing or transfer tonight.  Please call to follow-up with your foot doctor who did this previous surgery and rest and stay hydrated.  Please use over-the-counter medications.  If any symptoms change or worsen acutely, please return to the nearest emergency department.

## 2023-11-09 ENCOUNTER — Telehealth (HOSPITAL_BASED_OUTPATIENT_CLINIC_OR_DEPARTMENT_OTHER): Payer: Self-pay

## 2023-12-13 ENCOUNTER — Ambulatory Visit (HOSPITAL_BASED_OUTPATIENT_CLINIC_OR_DEPARTMENT_OTHER): Admission: RE | Admit: 2023-12-13
